# Patient Record
Sex: Male | Born: 1935 | Race: White | Hispanic: No | Marital: Married | Smoking: Never smoker
Health system: Southern US, Community
[De-identification: ages and names within clinical notes are randomized; demographics above are authoritative.]

## PROBLEM LIST (undated history)

## (undated) DIAGNOSIS — I340 Nonrheumatic mitral (valve) insufficiency: Secondary | ICD-10-CM

## (undated) DIAGNOSIS — I10 Essential (primary) hypertension: Secondary | ICD-10-CM

## (undated) DIAGNOSIS — H9190 Unspecified hearing loss, unspecified ear: Secondary | ICD-10-CM

## (undated) HISTORY — PX: PACEMAKER INSERTION: SHX728

## (undated) HISTORY — PX: CORONARY ARTERY BYPASS GRAFT: SHX141

## (undated) HISTORY — PX: CARDIAC DEFIBRILLATOR PLACEMENT: SHX171

---

## 2014-11-07 ENCOUNTER — Encounter (HOSPITAL_COMMUNITY): Payer: Self-pay | Admitting: Vascular Surgery

## 2014-11-07 ENCOUNTER — Emergency Department (HOSPITAL_COMMUNITY): Payer: No Typology Code available for payment source

## 2014-11-07 DIAGNOSIS — Z880 Allergy status to penicillin: Secondary | ICD-10-CM | POA: Insufficient documentation

## 2014-11-07 DIAGNOSIS — Z951 Presence of aortocoronary bypass graft: Secondary | ICD-10-CM | POA: Insufficient documentation

## 2014-11-07 DIAGNOSIS — S4991XA Unspecified injury of right shoulder and upper arm, initial encounter: Secondary | ICD-10-CM | POA: Insufficient documentation

## 2014-11-07 DIAGNOSIS — Y998 Other external cause status: Secondary | ICD-10-CM | POA: Diagnosis not present

## 2014-11-07 DIAGNOSIS — Z95810 Presence of automatic (implantable) cardiac defibrillator: Secondary | ICD-10-CM | POA: Insufficient documentation

## 2014-11-07 DIAGNOSIS — I10 Essential (primary) hypertension: Secondary | ICD-10-CM | POA: Insufficient documentation

## 2014-11-07 DIAGNOSIS — Z79899 Other long term (current) drug therapy: Secondary | ICD-10-CM | POA: Insufficient documentation

## 2014-11-07 DIAGNOSIS — Y92410 Unspecified street and highway as the place of occurrence of the external cause: Secondary | ICD-10-CM | POA: Insufficient documentation

## 2014-11-07 DIAGNOSIS — H9190 Unspecified hearing loss, unspecified ear: Secondary | ICD-10-CM | POA: Diagnosis not present

## 2014-11-07 DIAGNOSIS — S199XXA Unspecified injury of neck, initial encounter: Secondary | ICD-10-CM | POA: Insufficient documentation

## 2014-11-07 DIAGNOSIS — S0990XA Unspecified injury of head, initial encounter: Secondary | ICD-10-CM | POA: Diagnosis not present

## 2014-11-07 DIAGNOSIS — Y9389 Activity, other specified: Secondary | ICD-10-CM | POA: Diagnosis not present
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
## 2014-11-08 ENCOUNTER — Emergency Department (HOSPITAL_COMMUNITY): Payer: No Typology Code available for payment source

## 2014-11-08 ENCOUNTER — Encounter (HOSPITAL_COMMUNITY): Payer: Self-pay | Admitting: *Deleted

## 2014-11-08 ENCOUNTER — Emergency Department (HOSPITAL_COMMUNITY)
Admission: EM | Admit: 2014-11-08 | Discharge: 2014-11-08 | Disposition: A | Discharge status: Home / Self Care | Payer: No Typology Code available for payment source | Attending: Emergency Medicine | Admitting: Emergency Medicine

## 2014-11-08 DIAGNOSIS — S4991XA Unspecified injury of right shoulder and upper arm, initial encounter: Secondary | ICD-10-CM | POA: Diagnosis not present

## 2014-11-08 HISTORY — DX: Unspecified hearing loss, unspecified ear: H91.90

## 2014-11-08 HISTORY — DX: Essential (primary) hypertension: I10

## 2014-11-08 MED ORDER — TRAMADOL HCL 50 MG PO TABS
50.0000 mg | ORAL_TABLET | Freq: Once | ORAL | Status: AC
  Administered 2014-11-08: 50 mg via ORAL
  Filled 2014-11-08: qty 1
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
## 2014-11-08 NOTE — ED Notes (Signed)
Patient transported to CT 

## 2014-11-08 NOTE — ED Notes (Signed)
Pt provided w/ ice pack

## 2014-11-08 NOTE — ED Notes (Signed)
Pt ambulating independently w/ steady gait on d/c in no acute distress, A&Ox4.D/c instructions reviewed w/ pt and family - pt and family deny any further questions or concerns at present.  

## 2014-11-08 NOTE — ED Notes (Signed)
Pt went to CT
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
## 2014-11-08 NOTE — ED Notes (Signed)
Pt declined an ice pack at this time
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
## 2016-12-22 ENCOUNTER — Encounter

## 2016-12-24 ENCOUNTER — Ambulatory Visit
Admit: 2016-12-24 | Discharge: 2016-12-24 | Payer: MEDICARE | Attending: Thoracic Surgery (Cardiothoracic Vascular Surgery) | Primary: Family Medicine

## 2016-12-24 ENCOUNTER — Inpatient Hospital Stay: Admit: 2016-12-24 | Payer: MEDICARE | Attending: Nurse Practitioner | Primary: Family Medicine

## 2016-12-24 ENCOUNTER — Ambulatory Visit: Attending: Thoracic Surgery (Cardiothoracic Vascular Surgery) | Primary: Family Medicine

## 2016-12-24 DIAGNOSIS — I340 Nonrheumatic mitral (valve) insufficiency: Secondary | ICD-10-CM

## 2016-12-24 LAB — ECHOCARDIOGRAM COMPLETE 2D W DOPPLER W COLOR: Left Ventricular Ejection Fraction: 58

## 2016-12-24 NOTE — Progress Notes (Signed)
Patient: David Le   Age: 81 y.o.     Patient Care Team:  Veva Holes, MD as PCP - General (Family Practice)  Ron Agee, MD (Cardiothoracic Surgery)  Altha Harm, MD (Cardiology)    PCP: Veva Holes, MD    Cardiologist: Glendon Axe    Diagnosis/Reason for Consultation: The primary encounter diagnosis was Non-rheumatic mitral regurgitation. Diagnoses of DOE (dyspnea on exertion), Mitral valve insufficiency, unspecified etiology, Ischemic cardiomyopathy, S/P CABG x 3, S/P implantation of automatic cardioverter/defibrillator (AICD), and Coronary artery disease involving native coronary artery of native heart without angina pectoris were also pertinent to this visit.    Problem List:   Patient Active Problem List   Diagnosis Code   ??? Non-rheumatic mitral regurgitation I34.0   ??? Coronary artery disease involving native coronary artery of native heart without angina pectoris I25.10   ??? Ischemic cardiomyopathy I25.5   ??? S/P CABG x 3 Z95.1   ??? S/P implantation of automatic cardioverter/defibrillator (AICD) Z95.810       HPI: 81 y.o. Caucasian male with PMHx of MR, ICM, CABG X 3 (LIMA to LAD, SVG to Cx, SVG to PDA by Dr. Cristal Deer 11/2005), BiV AICD (06/2006 and generator change [facility] Scientific CRT-D Model G141 / Serial# [id number] in 10/2013), s/p cholecystectomy, s/p inguinal hernia repair that is [facility] by Dr. Glendon Axe for interventional evaluation of MR.    Mr. David Le has known about his MR since his CABG in 2007.  It has been tracked serially by Dr. Glendon Axe.  As his most recent routine f/u he c/o fatigue and easy DOE.  A TTE in May revealed mod-severe MR.  During a [place] the [place] in [place] he had worsening SOB/DOE.  Today he c/o SOB/DOE w/ walking on a flat surface after about 10-15 minutes. He also c/o two pillow orthopnea and PND 2-3 times/night.  He denies any CP, chest tightness, palpitations, lightheadedness, syncope, falls or  arrythmias.  He further denies any cardiac hospitalization in the past 12+ months. He denies any medication changes in the past 6+ months.     He is HOH with hearing aides.  He is accompanied by his wife today.  He is independent in his ADLs, medication administration, and medical decision making.     NYHA Classification: II   Class II (Mild): Slight limitation of physical activity. Comfortable at rest, but ordinary physical activity results in fatigue, palpitation, or dyspnea.     Angina Classification: 0   Class 0: No symptoms    Past Medical History:   Diagnosis Date   ??? CAD (coronary artery disease)    ??? Congestive heart failure (HCC)    ??? ICD (implantable cardioverter-defibrillator) in place    ??? Murmur    ??? Pacemaker    ??? Valvular heart disease      Endocarditis: no  Pulmonary HTN:  No Greater than 2/3 systemic: N/A  Immunocompromised/Steroids: no  Heart Failure Admission w/in past year:  no  Heart Failure Admission w/in past 2 weeks: no  Liver Dz/Cirrhosis: no  If yes, MELD score: N/A    Past Surgical History:   Procedure Laterality Date   ??? HX CORONARY ARTERY BYPASS GRAFT     ??? HX HEART CATHETERIZATION     ??? HX PACEMAKER        Social History     Tobacco Use   ??? Smoking status: Not on file   Substance Use Topics   ??? Alcohol  use: Not on file      No family history on file.  Prior to Admission medications    Medication Sig Start Date End Date Taking? Authorizing Provider   acetaminophen (TYLENOL EXTRA STRENGTH) 500 mg tablet Take 500 mg by mouth.   Yes Provider, Historical   aspirin delayed-release 81 mg tablet Take 81 mg by mouth daily.   Yes Provider, Historical   atorvastatin (LIPITOR) 20 mg tablet Take 20 mg by mouth daily.   Yes Provider, Historical   carvedilol (COREG) 12.5 mg tablet Take 12.5 mg by mouth two (2) times daily (with meals).   Yes Provider, Historical   ferrous gluconate 324 mg (38 mg iron) tablet Take  by mouth Daily (before breakfast).   Yes Provider, Historical    folic acid 800 mcg tablet Take 800 mcg by mouth daily.   Yes Provider, Historical   furosemide (LASIX) 10 mg/mL oral solution Take 10 mg by mouth daily.   Yes Provider, Historical   lisinopril (PRINIVIL, ZESTRIL) 2.5 mg tablet Take 2.5 mg by mouth two (2) times a day.   Yes Provider, Historical   potassium chloride (K-DUR, KLOR-CON) 20 mEq tablet Take 20 mEq by mouth two (2) times a day.   Yes Provider, Historical   spironolactone (ALDACTONE) 25 mg tablet Take 25 mg by mouth daily.   Yes Provider, Historical   melatonin tab tablet Take 5 mg by mouth as needed.   Yes Provider, Historical       Allergies   Allergen Reactions   ??? Pcn [Penicillins] Unknown (comments)       Current Medications:     Vitals: Blood pressure 116/66, pulse 70, temperature 97.5 ??F (36.4 ??C), temperature source Oral, resp. rate 24, height 5' 10.5" (1.791 m), weight 179 lb (81.2 kg), SpO2 100 %.     Pre 6 min walk VS: HR 68, RR 16, 94/56, 95% sat on RA    Allergies: is allergic to pcn [penicillins].    Review of Systems: Pertinent Positives per HPI   [x] Total of 13 systems reviewed as follows:  Constitutional: Negative fever, negative chills  Eyes:   Negative for amauroses fugax  ENT:   Negative sore throat,oral absecess  Endocrine Negative for thyroid replacement Rx; goiter; DM  Respiratory:  Negative chronic cough,sputum production  Cards:   Negative for palpitations, lower extremity edema, varicosities, claudication  GI:   Negative for dysphagia, bleeding, nausea, vomiting, diarrhea, and abdominal pain  Genitourinary: Negative for frequency, dysuria, BPH   Integument:  Negative for rash and pruritus  Hematologic:  Negative for easy bruising; bleeding dyscarsia  Musculoskel: Negative for muscle weakness inhibiting ambulation  Neurological:  Negative for stroke, TIA, syncope, dizziness  Behavl/Psych: Negative for feelings of anxiety, depression     Cardiovascular Testing:   EKG: none     TTE  06/30/2016:  LVEF 60% w/o any abnormal motion, Grade II Diastolic Dysfunction, RV wnl, RVSP estimated 42 mmHg, thickened AV leaflets, mod-severe MR w/ thickened leaflets and w/ posterior MV prolapse, TV wnl, mild AI    TTE today: done but read not available at time of this note    TEE 12/17/2016: LVEF wnl, LA enlargement, severe MR, mild AI    L/R Cardiac catheterization 12/17/2016: Grafts patent; RA: 6, RV: 31/5/-, PA: 30/15/19, PCWP: 13    Physical Exam:  General: Well nourished well groomed gentleman appearing stated age accompanied by his wife  Neuro: A&OX3. MAE. PERRL. Steady un-assisted gait  Head:Normocephalic. Atraumatic. Symmetrical  Neck:  Trachea Midline  Resp: CTA B. No Adv BS/cough/sputum/tachypnea with seated conversation  CV: S1S2 RRR. SEM II/VI. No JVD/carotid bruits. Pink/warm/dry extremities. No LE peripheral edema  VH:QIONGE ab. Soft. NT/ND. Active BS  GU: Voids  Integ: No obvious s/s of infection or breakdown  Musculo/Skeletal: FROM in all major joints. Good muscle tone    Clinic Evaluation:   KCCQ-12: scanned into EMR    6 minute walk test: PreTest HR/02 sat:  See VS this visit - completed 6 min             Post Test HR/02 sat: 70 / 100% sat on RA             Distance Walked: 812 ft 5 inches    Frality Survey:  Myrtis Ser Index ADL - 6/6  scanned into EMR     STS 2.81 Risk Score / Predicted 30 day mortality: - calculations scanned into EMR   Procedure: MV Repair      Risk of Mortality: 2.273%    Morbidity or Mortality: 14.981%    Long Length of Stay: 6.762%    Short Length of Stay: 34.962%    Permanent Stroke: 1.606%    Prolonged Ventilation: 8.208%    DSW Infection: 0.192%    Renal Failure: 3.652%    Reoperation: 7.017%   Procedure: MV Replacement Only    Risk of Mortality: 3.69%    Morbidity or Mortality: 21.042%    Long Length of Stay: 10.329%    Short Length of Stay: 25.54%    Permanent Stroke: 1.606%    Prolonged Ventilation: 11.286%    DSW Infection: 0.192%    Renal Failure: 4.533%     Reoperation: 10.759%    Assessment/Plan:   1.  MR - severe and symptomatic - At least intermediate surgical risk. Risks/benefits of TMVr reviewed w/ pt and wife and they wish to proceed.    2. CAD s/p CABG X 3 (2007) - patent grafts on cath. ASA/BB/Statin per cards  3. ICM s/p BiV-AICD - generator change 2015 -  Copy of card scanned into EMR  4. Further plan/care by Dr. Reyes Ivan

## 2016-12-24 NOTE — Progress Notes (Signed)
Pt seen and examined  Full note reviewed  Cath and echos reviewed and discussed with Dr [name]   See note by Hoy RegisterLindsey Guirgues, NP   He has severe degenerative MR and symx   He is at high surgical risk given redo sternotomy and patent bypass graphs   Discussed indications and risk for TMVR   He agrees to proceed

## 2016-12-24 NOTE — Addendum Note (Signed)
Addended by: Sallyanne HaversERR, DEBORAH P on: 12/30/2016 11:34 AM     Modules accepted: Level of Service

## 2017-01-07 ENCOUNTER — Ambulatory Visit
Admit: 2017-01-07 | Discharge: 2017-01-07 | Payer: MEDICARE | Attending: Thoracic Surgery (Cardiothoracic Vascular Surgery) | Primary: Family Medicine

## 2017-01-07 ENCOUNTER — Encounter

## 2017-01-07 ENCOUNTER — Inpatient Hospital Stay: Admit: 2017-01-07 | Payer: MEDICARE | Attending: Family | Primary: Family Medicine

## 2017-01-07 ENCOUNTER — Inpatient Hospital Stay: Admit: 2017-01-07 | Payer: MEDICARE | Primary: Family Medicine

## 2017-01-07 DIAGNOSIS — Z01818 Encounter for other preprocedural examination: Secondary | ICD-10-CM

## 2017-01-07 LAB — CBC WITH AUTOMATED DIFF
ABS. BASOPHILS: 0 10*3/uL (ref 0.0–0.1)
ABS. EOSINOPHILS: 0.3 10*3/uL (ref 0.0–0.4)
ABS. IMM. GRANS.: 0 10*3/uL (ref 0.00–0.04)
ABS. LYMPHOCYTES: 1.3 10*3/uL (ref 0.8–3.5)
ABS. MONOCYTES: 0.9 10*3/uL (ref 0.0–1.0)
ABS. NEUTROPHILS: 6.1 10*3/uL (ref 1.8–8.0)
ABSOLUTE NRBC: 0 10*3/uL (ref 0.00–0.01)
BASOPHILS: 1 % (ref 0–1)
EOSINOPHILS: 3 % (ref 0–7)
HCT: 40.9 % (ref 36.6–50.3)
HGB: 13.9 g/dL (ref 12.1–17.0)
IMMATURE GRANULOCYTES: 0 % (ref 0.0–0.5)
LYMPHOCYTES: 15 % (ref 12–49)
MCH: 33.7 PG (ref 26.0–34.0)
MCHC: 34 g/dL (ref 30.0–36.5)
MCV: 99 FL (ref 80.0–99.0)
MONOCYTES: 10 % (ref 5–13)
MPV: 11.7 FL (ref 8.9–12.9)
NEUTROPHILS: 71 % (ref 32–75)
NRBC: 0 PER 100 WBC
PLATELET: 137 10*3/uL — ABNORMAL LOW (ref 150–400)
RBC: 4.13 M/uL (ref 4.10–5.70)
RDW: 12.4 % (ref 11.5–14.5)
WBC: 8.6 10*3/uL (ref 4.1–11.1)

## 2017-01-07 LAB — POC G3 - PUL
Base excess (POC): 2 mmol/L
FIO2 (POC): 21 %
HCO3 (POC): 26.6 MMOL/L — ABNORMAL HIGH (ref 22–26)
pCO2 (POC): 39.5 MMHG (ref 35.0–45.0)
pH (POC): 7.437 (ref 7.35–7.45)
pO2 (POC): 75 MMHG — ABNORMAL LOW (ref 80–100)
sO2 (POC): 95 % (ref 92–97)

## 2017-01-07 LAB — METABOLIC PANEL, COMPREHENSIVE
A-G Ratio: 0.9 — ABNORMAL LOW (ref 1.1–2.2)
ALT (SGPT): 23 U/L (ref 12–78)
AST (SGOT): 13 U/L — ABNORMAL LOW (ref 15–37)
Albumin: 3.4 g/dL — ABNORMAL LOW (ref 3.5–5.0)
Alk. phosphatase: 52 U/L (ref 45–117)
Anion gap: 8 mmol/L (ref 5–15)
BUN/Creatinine ratio: 13 (ref 12–20)
BUN: 13 MG/DL (ref 6–20)
Bilirubin, total: 0.6 MG/DL (ref 0.2–1.0)
CO2: 28 mmol/L (ref 21–32)
Calcium: 8.7 MG/DL (ref 8.5–10.1)
Chloride: 105 mmol/L (ref 97–108)
Creatinine: 0.99 MG/DL (ref 0.70–1.30)
GFR est AA: >60 mL/min/{1.73_m2} (ref >60)
GFR est non-AA: >60 mL/min/{1.73_m2} (ref >60)
Globulin: 3.7 g/dL (ref 2.0–4.0)
Glucose: 110 mg/dL — ABNORMAL HIGH (ref 65–100)
Potassium: 4.2 mmol/L (ref 3.5–5.1)
Protein, total: 7.1 g/dL (ref 6.4–8.2)
Sodium: 141 mmol/L (ref 136–145)

## 2017-01-07 LAB — EKG, 12 LEAD, INITIAL
Atrial Rate: 65 {beats}/min
Calculated P Axis: 68 degrees
Calculated R Axis: -173 degrees
Calculated T Axis: 79 degrees
P-R Interval: 150 ms
Q-T Interval: 466 ms
QRS Duration: 154 ms
QTC Calculation (Bezet): 484 ms
Ventricular Rate: 65 {beats}/min

## 2017-01-07 LAB — URINALYSIS W/ REFLEX CULTURE
Bacteria: NEGATIVE /hpf
Bilirubin: NEGATIVE
Blood: NEGATIVE
Glucose: NEGATIVE mg/dL
Ketone: NEGATIVE mg/dL
Nitrites: NEGATIVE
Protein: NEGATIVE mg/dL
Specific gravity: 1.015 (ref 1.003–1.030)
Urobilinogen: 0.2 EU/dL (ref 0.2–1.0)
pH (UA): 5 (ref 5.0–8.0)

## 2017-01-07 LAB — PTT: aPTT: 28.8 s (ref 22.1–32.0)

## 2017-01-07 LAB — TYPE + CROSSMATCH
ABO/Rh(D): O POS
Antibody screen: NEGATIVE

## 2017-01-07 LAB — PROTHROMBIN TIME + INR
INR: 1 (ref 0.9–1.1)
Prothrombin time: 10.5 s (ref 9.0–11.1)

## 2017-01-07 LAB — HEMOGLOBIN A1C WITH EAG
Est. average glucose: 117 mg/dL
Hemoglobin A1c: 5.7 % (ref 4.2–6.3)

## 2017-01-07 LAB — MAGNESIUM: Magnesium: 2.2 mg/dL (ref 1.6–2.4)

## 2017-01-07 LAB — NT-PRO BNP: NT pro-BNP: 310 PG/ML (ref 0–450)

## 2017-01-07 LAB — TSH 3RD GENERATION: TSH: 2.66 u[IU]/mL (ref 0.36–3.74)

## 2017-01-07 LAB — EKG 12-LEAD
Atrial Rate: 65 {beats}/min
P Axis: 68 degrees
P-R Interval: 150 ms
Q-T Interval: 466 ms
QRS Duration: 154 ms
QTc Calculation (Bazett): 484 ms
R Axis: -173 degrees
T Axis: 79 degrees
Ventricular Rate: 65 {beats}/min

## 2017-01-07 NOTE — H&P (Signed)
CSS   History and Physical    Subjective:    **information obtained from previous office note -update at the bottom    81 y.o. Caucasian male with PMHx of MR, ICM, CABG X 3 (LIMA to LAD, SVG to Cx, SVG to PDA by Dr. Cristal Deer 11/2005), BiV AICD (06/2006 and generator change [facility] Scientific CRT-D Model G141 / Serial# [id number] in 10/2013), s/p cholecystectomy, s/p inguinal hernia repair that is [facility] by Dr. Glendon Axe for interventional evaluation of MR.  ??  Mr. Graumann has known about his MR since his CABG in 2007.  It has been tracked serially by Dr. Glendon Axe.  As his most recent routine f/u he c/o fatigue and easy DOE.  A TTE in May revealed mod-severe MR.  During a vacation to the national parks in [place] he had worsening SOB/DOE.  Today he c/o SOB/DOE w/ walking on a flat surface after about 10-15 minutes. He also c/o two pillow orthopnea and PND 2-3 times/night.  He denies any CP, chest tightness, palpitations, lightheadedness, syncope, falls or arrythmias.  He further denies any cardiac hospitalization in the past 12+ months. He denies any medication changes in the past 6+ months.   ??  He is HOH with hearing aides.  He is accompanied by his wife today.  He is independent in his ADLs, medication administration, and medical decision making.    **update: The patient has been complaining of an URI with nasal congestion. [facility] saw him in his home on 01/05/17 and prescribed nasacort and coricidin hbp. The patient reports improvement in symptoms over the past couple of days.      NYHA Classification: II              Class II (Mild): Slight limitation of physical activity. Comfortable at rest, but ordinary physical activity results in fatigue, palpitation, or dyspnea.                Angina Classification: 0              Class 0: No symptoms    Cardiac Testing    TTE  06/30/2016:  LVEF 60% w/o any abnormal motion, Grade II Diastolic  Dysfunction, RV wnl, RVSP estimated 42 mmHg, thickened AV leaflets, mod-severe MR w/ thickened leaflets and w/ posterior MV prolapse, TV wnl, mild AI  ??  TTE 12/24/16: Procedure information: Image quality was fair.    Left ventricle: Systolic function was normal. Ejection fraction was  estimated in the range of 55 % to 60 %. There was mild hypokinesis of the  apical anterior wall(s).    Left atrium: The atrium was mildly dilated.    Mitral valve: There was a mild prolapse involving the anterior and  posterior leaflets. There was moderate to severe regurgitation.    Tricuspid valve: There was mild to moderate regurgitation.  ??  TEE 12/17/2016: LVEF wnl, LA enlargement, severe MR, mild AI  ??  L/R Cardiac catheterization 12/17/2016: Grafts patent; RA: 6, RV: 31/5/-, PA: 30/15/19, PCWP: 13        Past Medical History:   Diagnosis Date   ??? CAD (coronary artery disease)    ??? Congestive heart failure (HCC)    ??? ICD (implantable cardioverter-defibrillator) in place    ??? Murmur    ??? Pacemaker    ??? Valvular heart disease      Past Surgical History:   Procedure Laterality Date   ??? HX CORONARY ARTERY BYPASS GRAFT     ???  HX HEART CATHETERIZATION     ??? HX PACEMAKER        Social History     Tobacco Use   ??? Smoking status: Not on file   Substance Use Topics   ??? Alcohol use: Not on file      No family history on file.  Prior to Admission medications    Medication Sig Start Date End Date Taking? Authorizing Provider   acetaminophen (TYLENOL EXTRA STRENGTH) 500 mg tablet Take 500 mg by mouth.    Provider, Historical   aspirin delayed-release 81 mg tablet Take 81 mg by mouth daily.    Provider, Historical   atorvastatin (LIPITOR) 20 mg tablet Take 20 mg by mouth daily.    Provider, Historical   carvedilol (COREG) 12.5 mg tablet Take 12.5 mg by mouth two (2) times daily (with meals).    Provider, Historical   ferrous gluconate 324 mg (38 mg iron) tablet Take  by mouth Daily (before breakfast).    Provider, Historical    folic acid 800 mcg tablet Take 800 mcg by mouth daily.    Provider, Historical   furosemide (LASIX) 10 mg/mL oral solution Take 10 mg by mouth daily.    Provider, Historical   lisinopril (PRINIVIL, ZESTRIL) 2.5 mg tablet Take 2.5 mg by mouth two (2) times a day.    Provider, Historical   potassium chloride (K-DUR, KLOR-CON) 20 mEq tablet Take 20 mEq by mouth two (2) times a day.    Provider, Historical   spironolactone (ALDACTONE) 25 mg tablet Take 25 mg by mouth daily.    Provider, Historical   melatonin tab tablet Take 5 mg by mouth as needed.    Provider, Historical       Allergies   Allergen Reactions   ??? Pcn [Penicillins] Unknown (comments)     Review of Systems: pertinent positives in HPI  Consititutional: Denies fever or chills.  Eyes:  Denies use of glasses or vision problems(cataracts).  ENT:  Denies hearing or swallowing difficulty.  CV: Denies CP, claudication, HTN.  Resp: Denies dyspnea, productive cough.  GU: Denies dialysis or kidney problems.  GI: Denies ulcers, esophageal strictures, liver problems.  M/S: Denies joint or bone problems, or implanted artificial hardware.  Skin: Denies varicose veins, edema.   Neuro: Denies strokes, or TIAs.  Psych: Denies anxiety or depression.  Endocrine: Denies thyroid problems or diabetes.  Heme/Lymphatic: Denies easy bruising or lymphedema.     Objective:     VS: T 97.8 HR 67 RR 16 BP 102/64 sats 94%    Physical Exam:    General appearance: alert, cooperative, no distress  Head: normocephalic, without obvious abnormality; atraumatic  Eyes: conjunctivae/corneas clear; EOM's intact.   Nose: nares normal; no drainage.  Neck: no carotid bruit and no JVD  Lungs: clear to auscultation bilaterally  Heart: regular rate and rhythm; +2/6 murmur  Abdomen: soft, non-tender; bowel sounds normal  Extremities: moves all extremities; no weakness.  Skin: Skin color normal; No varicose veins or edema.  Neurologic: Grossly normal      Labs:   Recent Labs     01/07/17  1205    WBC 8.6   HGB 13.9   HCT 40.9   PLT 137*   NA 141   K 4.2   BUN 13   CREA 0.99   GLU 110*   INR 1.0       Diagnostics:   PA and lateral: IMPRESSION: There is minor atelectasis/scarring left base otherwise lungs  are  clear    Carotid doppler: pending    PFTS-FEV1: pending    EKG: Electronic ventricular pacemaker     Assessment:     Active Problems:    Non-rheumatic mitral regurgitation (12/24/2016)        Plan:   The risk and benefit of surgery were reviewed with patient and family and all questions answered and the patient wishes to proceed. Risk include infection, bleeding, stroke, heart attack, irregular heart rhythm, kidney failure and death. The patient was instructed to take last dose of lasix/K, lisinopril, aldactone on 01/12/17.    Surgery is scheduled for 01/14/17.    STS 2.81 Risk Score / Predicted 30 day mortality: - calculations scanned into EMR              Procedure: MV Repair                                                Risk of Mortality: 2.273%                          Morbidity or Mortality: 14.981%                          Long Length of Stay: 6.762%                          Short Length of Stay: 34.962%                          Permanent Stroke: 1.606%                          Prolonged Ventilation: 8.208%                          DSW Infection: 0.192%                          Renal Failure: 3.652%                          Reoperation: 7.017%              Procedure: MV Replacement Only                          Risk of Mortality: 3.69%                          Morbidity or Mortality: 21.042%                          Long Length of Stay: 10.329%                          Short Length of Stay: 25.54%                          Permanent Stroke: 1.606%  Prolonged Ventilation: 11.286%                          DSW Infection: 0.192%                          Renal Failure: 4.533%                          Reoperation: 10.759%  ??  Assessment/Plan:    1.  MR: Plan for TMVr with Dr. Reyes IvanBladergroen and Dr. Jaynie CollinsLim.  2. CAD s/p CABG X 3 (2007) - patent grafts on cath. ASA/BB/Statin per cards  3. ICM s/p BiV-AICD - generator change 2015 -  Copy of card scanned into EMR  4. Further plan/care by Dr. Reyes IvanBladergroen           Signed By: Clide CliffMeredith Ranard Harte, NP     January 07, 2017

## 2017-01-07 NOTE — Progress Notes (Signed)
I had the pleasure of seeing David Le in the valve clinic - he has severe MR due to prolapse/flail, and went into HF at altitude recently. Per Dr. Marva PandaBladergroen's assessment, he is high surgical risk due to overall conditioning, and would be reasonable for a MitraClip.   He currently is recovering from a URI, and will re-assess him Friday to decide about proceding next week.

## 2017-01-07 NOTE — Other (Signed)
Anesthesia called to consult on upcoming cardaic surgery; anesthesia unavailable at this time; pt sent to [facility] for abg's

## 2017-01-09 ENCOUNTER — Encounter: Attending: Thoracic Surgery (Cardiothoracic Vascular Surgery) | Primary: Family Medicine

## 2017-01-09 ENCOUNTER — Inpatient Hospital Stay: Admit: 2017-01-09 | Payer: MEDICARE | Attending: Acute Care | Primary: Family Medicine

## 2017-01-09 ENCOUNTER — Ambulatory Visit
Admit: 2017-01-09 | Discharge: 2017-01-09 | Payer: MEDICARE | Attending: Thoracic Surgery (Cardiothoracic Vascular Surgery) | Primary: Family Medicine

## 2017-01-09 DIAGNOSIS — J189 Pneumonia, unspecified organism: Secondary | ICD-10-CM

## 2017-01-09 DIAGNOSIS — I340 Nonrheumatic mitral (valve) insufficiency: Secondary | ICD-10-CM

## 2017-01-09 NOTE — Procedures (Signed)
[facility]  PULMON[facility] FUN[facility]    [name]:[name], David  MR#: [id number]  DOB: 04-27-1935  ACCOUNT #: [id number]   DATE OF SERVICE: 01/09/2017    REFERRING:  Clide CliffMeredith Newton, MD, Veva HolesJohn Siedlecki, MD    INDICATION:  Non-rheumatic mitral regurg.    Pulmonary functions, 01/09/2017, are reviewed.  Patient's vital capacity is 69% of predicted, 2.85 liters.  FEV1 is 1.99 liters, 75% of predicted.  FEV1/FVC ratio is preserved at 69% of predicted, 64.  Flow volume loop demonstrates minimal coving of the expiratory limb.    IMPRESSION:  Possible mild restrictive respiratory pulmonary mechanic abnormality without evidence of overt obstruction.  Clinical correlation and consideration of additional testing with complete PFTs advised.      Clarita CraneANIEL R. Zakaree Mcclenahan, MD       DRS / DN  D: 01/12/2017 09:02     T: 01/12/2017 09:48  JOB #: [id number]

## 2017-01-09 NOTE — Progress Notes (Signed)
[facility] NP:  Pt in office today for PNA f/u.    Saw PCP this morning and received cephtriaxone injection and will receive two more in the next days.    Quite somulent today. Cough slightly improved. Will defer TMVr until recovered from infectious process.  All understand and are in agreement.  TMVr rescheduled for 12/5.

## 2017-01-11 NOTE — Procedures (Signed)
[facility]  *** FINAL REPORT ***    Name: David Le, David Le  MRN: [id number]    Outpatient  DOB: 12 Sep 1935  HIS Order #: [id number]  TRAKnet Visit #: [id number]  Date: 09 Jan 2017    TYPE OF TEST: Cerebrovascular Duplex    REASON FOR TEST  pre-op for cardiac surgery    Right Carotid:-             Proximal               Mid                 Distal  cm/s  Systolic  Diastolic  Systolic  Diastolic  Systolic  Diastolic  CCA:     08.677.0      13.0                            72.0      12.0  Bulb:  ECA:    118.0  ICA:     84.0      27.0       86.0      26.0       72.0      19.0  ICA/CCA:  1.2       2.3    ICA Stenosis:    Right Vertebral:-  Finding: Antegrade  Sys:       47.0  Dia:    Right Subclavian:    Left Carotid:-            Proximal                Mid                 Distal  cm/s  Systolic  Diastolic  Systolic  Diastolic  Systolic  Diastolic  CCA:     57.873.0       9.0                            87.0      16.0  Bulb:  ECA:    116.0  ICA:     71.0      17.0       61.0      15.0       74.0      22.0  ICA/CCA:  0.8       1.1    ICA Stenosis:    Left Vertebral:-  Finding: Antegrade  Sys:       46.0  Dia:    Left Subclavian:    INTERPRETATION/FINDINGS  PROCEDURE:  Color duplex ultrasound imaging of extracranial  cerebrovascular arteries.    FINDINGS:       Right:  Internal carotid velocity is within normal limits.  There   is narrowing of the internal carotid flow channel on color Doppler  imaging and calcific plaque on B-mode imaging, consistent with less  than 50 percent stenosis.  The common and external carotid arteries  are patent and without evidence of hemodynamically significant  stenosis.       Left:  Internal carotid velocity is within normal limits.  There  is narrowing of the internal carotid flow channel on color Doppler  imaging and mixed density plaque on B-mode imaging, consistent with  less than 50 percent stenosis.  The common and external carotid  arteries are patent and without evidence  of  hemodynamically  significant stenosis.    IMPRESSION:  Consistent with less than 50% stenosis of the right  internal carotid and less than 50% stenosis (low end) of the left  internal carotid.  Vertebrals are patent with antegrade flow.    ADDITIONAL COMMENTS    I have personally reviewed the data relevant to the interpretation of  this  study.    TECHNOLOGIST: Bonnye FavaJames Kaczor, RVT  Signed: 01/09/2017 02:34 PM    PHYSICIAN: Lenn SinkAvik Desi Rowe, MD  Signed: 01/11/2017 02:55 PM

## 2017-01-11 NOTE — Procedures (Signed)
[facility]  *** FINAL REPORT ***    Name: David Le, David Le  MRN: [id number]    Outpatient  DOB: 12 Sep 1935  HIS Order #: [id number]  TRAKnet Visit #: [id number]  Date: 09 Jan 2017    TYPE OF TEST: Cerebrovascular Duplex    REASON FOR TEST  pre-op for cardiac surgery    Right Carotid:-             Proximal               Mid                 Distal  cm/s  Systolic  Diastolic  Systolic  Diastolic  Systolic  Diastolic  CCA:     08.677.0      13.0                            72.0      12.0  Bulb:  ECA:    118.0  ICA:     84.0      27.0       86.0      26.0       72.0      19.0  ICA/CCA:  1.2       2.3    ICA Stenosis:    Right Vertebral:-  Finding: Antegrade  Sys:       47.0  Dia:    Right Subclavian:    Left Carotid:-            Proximal                Mid                 Distal  cm/s  Systolic  Diastolic  Systolic  Diastolic  Systolic  Diastolic  CCA:     57.873.0       9.0                            87.0      16.0  Bulb:  ECA:    116.0  ICA:     71.0      17.0       61.0      15.0       74.0      22.0  ICA/CCA:  0.8       1.1    ICA Stenosis:    Left Vertebral:-  Finding: Antegrade  Sys:       46.0  Dia:    Left Subclavian:    INTERPRETATION/FINDINGS  PROCEDURE:  Color duplex ultrasound imaging of extracranial  cerebrovascular arteries.    FINDINGS:       Right:  Internal carotid velocity is within normal limits.  There   is narrowing of the internal carotid flow channel on color Doppler  imaging and calcific plaque on B-mode imaging, consistent with less  than 50 percent stenosis.  The common and external carotid arteries  are patent and without evidence of hemodynamically significant  stenosis.       Left:  Internal carotid velocity is within normal limits.  There  is narrowing of the internal carotid flow channel on color Doppler  imaging and mixed density plaque on B-mode imaging, consistent with  less than 50 percent stenosis.  The common and external carotid   arteries are patent and without  evidence of hemodynamically  significant stenosis.    IMPRESSION:  Consistent with less than 50% stenosis of the right  internal carotid and less than 50% stenosis (low end) of the left  internal carotid.  Vertebrals are patent with antegrade flow.    ADDITIONAL COMMENTS    I have personally reviewed the data relevant to the interpretation of  this  study.    TECHNOLOGIST: Bonnye FavaJames Kaczor, RVT  Signed: 01/09/2017 02:34 PM    PHYSICIAN: Lenn SinkAvik Rusell Meneely, MD  Signed: 01/11/2017 02:55 PM

## 2017-01-12 NOTE — Procedures (Signed)
[facility]  PULMONARY FUNCTION    Name:David Le, David Le  MR#: [id number]  DOB: 08/10/1935  ACCOUNT #: [id number]   DATE OF SERVICE: 01/09/2017    REFERRING:  Meredith Newton, MD, John Siedlecki, MD    INDICATION:  Non-rheumatic mitral regurg.    Pulmonary functions, 01/09/2017, are reviewed.  Patient's vital capacity is 69% of predicted, 2.85 liters.  FEV1 is 1.99 liters, 75% of predicted.  FEV1/FVC ratio is preserved at 69% of predicted, 64.  Flow volume loop demonstrates minimal coving of the expiratory limb.    IMPRESSION:  Possible mild restrictive respiratory pulmonary mechanic abnormality without evidence of overt obstruction.  Clinical correlation and consideration of additional testing with complete PFTs advised.      Lindsi Bayliss R. Lindbergh Winkles, MD       DRS / DN  D: 01/12/2017 09:02     T: 01/12/2017 09:48  JOB #: [id number]

## 2017-01-14 ENCOUNTER — Encounter: Attending: Thoracic Surgery (Cardiothoracic Vascular Surgery) | Primary: Family Medicine

## 2017-01-23 NOTE — Telephone Encounter (Signed)
She thinks one of his medications has been left off the list and wants to check with you. Surgery is Wed. 12/5

## 2017-01-27 MED ORDER — PHENYLEPHRINE 10 MG/ML INJECTION
10 mg/mL | INTRAMUSCULAR | Status: DC
Start: 2017-01-27 — End: 2017-01-28

## 2017-01-27 MED ORDER — SODIUM CHLORIDE 0.9 % IV
100 unit/mL | INTRAVENOUS | Status: DC
Start: 2017-01-27 — End: 2017-01-28
  Administered 2017-01-28: 12:00:00 via INTRAVENOUS

## 2017-01-27 MED ORDER — PHENYLEPHRINE 10 MG/ML INJECTION
10 mg/mL | INTRAMUSCULAR | Status: DC
Start: 2017-01-27 — End: 2017-01-28
  Administered 2017-01-28 (×4): via INTRAVENOUS

## 2017-01-27 MED ORDER — IOPAMIDOL 41 % IV
200 mg iodine /mL (41 %) | Freq: Once | INTRAVENOUS | Status: DC
Start: 2017-01-27 — End: 2017-01-28
  Administered 2017-01-28: 12:00:00 via INTRAVENOUS

## 2017-01-27 MED ORDER — SODIUM CHLORIDE 0.9 % IV
1 mg/mL | INTRAVENOUS | Status: DC
Start: 2017-01-27 — End: 2017-01-28
  Administered 2017-01-28: 12:00:00 via INTRAVENOUS

## 2017-01-27 MED ORDER — NICARDIPINE 2.5 MG/ML IV
2510 mg/10 mL | INTRAVENOUS | Status: DC
Start: 2017-01-27 — End: 2017-01-28

## 2017-01-27 MED ORDER — SODIUM CHLORIDE 0.9 % IV
INTRAVENOUS | Status: AC
Start: 2017-01-27 — End: 2017-01-28
  Administered 2017-01-28: 16:00:00 via INTRAVENOUS

## 2017-01-27 MED FILL — SODIUM CHLORIDE 0.9 % IV: INTRAVENOUS | Qty: 1000

## 2017-01-27 MED FILL — ISOVUE-200  41 % INTRAVENOUS SOLUTION: 200 mg iodine /mL (41 %) | INTRAVENOUS | Qty: 200

## 2017-01-27 MED FILL — PHENYLEPHRINE 10 MG/ML INJECTION: 10 mg/mL | INTRAMUSCULAR | Qty: 1

## 2017-01-27 MED FILL — EPINEPHRINE 1 MG/ML IJ SOLN: 1 mg/mL | INTRAMUSCULAR | Qty: 2

## 2017-01-27 MED FILL — INSULIN REGULAR HUMAN 100 UNIT/ML INJECTION: 100 unit/mL | INTRAMUSCULAR | Qty: 1

## 2017-01-27 MED FILL — NICARDIPINE 2.5 MG/ML IV: 25 mg/10 mL | INTRAVENOUS | Qty: 20

## 2017-01-27 MED FILL — PHENYLEPHRINE 10 MG/ML INJECTION: 10 mg/mL | INTRAMUSCULAR | Qty: 3

## 2017-01-27 NOTE — Telephone Encounter (Signed)
Cardiac Surgery Care Coordinator-  Called David HoseJack Hao to review plan of care and day of surgery expectations.  Encouraged David HoseJack Idrovo to verbalize and offered emotional support.  David Le is without questions or concerns at this time.  Gavin PottersJill R Onie Kasparek, RN

## 2017-01-27 NOTE — Progress Notes (Signed)
Pt needs repeat T&C sent morning of surgery. Order is signed and held, thank you!

## 2017-01-28 ENCOUNTER — Inpatient Hospital Stay: Admit: 2017-01-28 | Payer: MEDICARE | Primary: Family Medicine

## 2017-01-28 ENCOUNTER — Inpatient Hospital Stay
Admit: 2017-01-28 | Discharge: 2017-01-29 | Disposition: A | Discharge status: Home / Self Care | Payer: MEDICARE | Attending: Thoracic Surgery (Cardiothoracic Vascular Surgery) | Admitting: Thoracic Surgery (Cardiothoracic Vascular Surgery)

## 2017-01-28 ENCOUNTER — Encounter
Admit: 2017-01-28 | Discharge: 2017-01-28 | Payer: MEDICARE | Attending: Thoracic Surgery (Cardiothoracic Vascular Surgery) | Primary: Family Medicine

## 2017-01-28 DIAGNOSIS — I340 Nonrheumatic mitral (valve) insufficiency: Secondary | ICD-10-CM

## 2017-01-28 LAB — CBC WITH AUTOMATED DIFF
ABS. BASOPHILS: 0 10*3/uL (ref 0.0–0.1)
ABS. EOSINOPHILS: 0.2 10*3/uL (ref 0.0–0.4)
ABS. IMM. GRANS.: 0 10*3/uL (ref 0.00–0.04)
ABS. LYMPHOCYTES: 1.6 10*3/uL (ref 0.8–3.5)
ABS. MONOCYTES: 0.4 10*3/uL (ref 0.0–1.0)
ABS. NEUTROPHILS: 3.7 10*3/uL (ref 1.8–8.0)
ABSOLUTE NRBC: 0 10*3/uL (ref 0.00–0.01)
BASOPHILS: 1 % (ref 0–1)
EOSINOPHILS: 4 % (ref 0–7)
HCT: 39.2 % (ref 36.6–50.3)
HGB: 13.1 g/dL (ref 12.1–17.0)
IMMATURE GRANULOCYTES: 0 % (ref 0.0–0.5)
LYMPHOCYTES: 26 % (ref 12–49)
MCH: 32.6 PG (ref 26.0–34.0)
MCHC: 33.4 g/dL (ref 30.0–36.5)
MCV: 97.5 FL (ref 80.0–99.0)
MONOCYTES: 7 % (ref 5–13)
MPV: 11.1 FL (ref 8.9–12.9)
NEUTROPHILS: 62 % (ref 32–75)
NRBC: 0 PER 100 WBC
PLATELET: 117 10*3/uL — ABNORMAL LOW (ref 150–400)
RBC: 4.02 M/uL — ABNORMAL LOW (ref 4.10–5.70)
RDW: 12.1 % (ref 11.5–14.5)
WBC: 6 10*3/uL (ref 4.1–11.1)

## 2017-01-28 LAB — METABOLIC PANEL, COMPREHENSIVE
A-G Ratio: 1 — ABNORMAL LOW (ref 1.1–2.2)
ALT (SGPT): 24 U/L (ref 12–78)
AST (SGOT): 17 U/L (ref 15–37)
Albumin: 2.9 g/dL — ABNORMAL LOW (ref 3.5–5.0)
Alk. phosphatase: 43 U/L — ABNORMAL LOW (ref 45–117)
Anion gap: 8 mmol/L (ref 5–15)
BUN/Creatinine ratio: 18 (ref 12–20)
BUN: 13 MG/DL (ref 6–20)
Bilirubin, total: 0.4 MG/DL (ref 0.2–1.0)
CO2: 23 mmol/L (ref 21–32)
Calcium: 7.7 MG/DL — ABNORMAL LOW (ref 8.5–10.1)
Chloride: 111 mmol/L — ABNORMAL HIGH (ref 97–108)
Creatinine: 0.74 MG/DL (ref 0.70–1.30)
GFR est AA: >60 mL/min/{1.73_m2} (ref >60)
GFR est non-AA: >60 mL/min/{1.73_m2} (ref >60)
Globulin: 2.9 g/dL (ref 2.0–4.0)
Glucose: 101 mg/dL — ABNORMAL HIGH (ref 65–100)
Potassium: 4 mmol/L (ref 3.5–5.1)
Protein, total: 5.8 g/dL — ABNORMAL LOW (ref 6.4–8.2)
Sodium: 142 mmol/L (ref 136–145)

## 2017-01-28 LAB — POC ACTIVATED CLOTTING TIME
Activated Clotting Time (POC): 241 SECS — ABNORMAL HIGH (ref 79–138)
Activated Clotting Time (POC): 241 SECS — ABNORMAL HIGH (ref 79–138)
Activated Clotting Time (POC): 384 SECS — ABNORMAL HIGH (ref 79–138)

## 2017-01-28 LAB — PROTHROMBIN TIME + INR
INR: 1.1 (ref 0.9–1.1)
Prothrombin time: 11.4 s — ABNORMAL HIGH (ref 9.0–11.1)

## 2017-01-28 LAB — PTT: aPTT: 31.9 s (ref 22.1–32.0)

## 2017-01-28 LAB — MAGNESIUM: Magnesium: 1.9 mg/dL (ref 1.6–2.4)

## 2017-01-28 MED ORDER — ONDANSETRON (PF) 4 MG/2 ML INJECTION
4 mg/2 mL | INTRAMUSCULAR | Status: DC | PRN
Start: 2017-01-28 — End: 2017-01-28
  Administered 2017-01-28: 15:00:00 via INTRAVENOUS

## 2017-01-28 MED ORDER — ROCURONIUM 10 MG/ML IV
10 mg/mL | INTRAVENOUS | Status: DC | PRN
Start: 2017-01-28 — End: 2017-01-28
  Administered 2017-01-28 (×2): via INTRAVENOUS

## 2017-01-28 MED ORDER — FENTANYL CITRATE (PF) 50 MCG/ML IJ SOLN
50 mcg/mL | INTRAMUSCULAR | Status: AC
Start: 2017-01-28 — End: ?

## 2017-01-28 MED ORDER — DIVALPROEX 500 MG 24 HR TAB
500 mg | Freq: Every day | ORAL | Status: DC
Start: 2017-01-28 — End: 2017-01-29
  Administered 2017-01-28 – 2017-01-29 (×2): via ORAL

## 2017-01-28 MED ORDER — PROTAMINE 10 MG/ML IV
10 mg/mL | INTRAVENOUS | Status: DC | PRN
Start: 2017-01-28 — End: 2017-01-28
  Administered 2017-01-28: 15:00:00 via INTRAVENOUS

## 2017-01-28 MED ORDER — FENTANYL CITRATE (PF) 50 MCG/ML IJ SOLN
50 mcg/mL | INTRAMUSCULAR | Status: DC | PRN
Start: 2017-01-28 — End: 2017-01-28
  Administered 2017-01-28: 12:00:00 via INTRAVENOUS

## 2017-01-28 MED ORDER — OXYCODONE 5 MG TAB
5 mg | ORAL | Status: DC | PRN
Start: 2017-01-28 — End: 2017-01-28

## 2017-01-28 MED ORDER — CEFAZOLIN 2 GRAM/20 ML IN STERILE WATER INTRAVENOUS SYRINGE
2 gram/0 mL | Freq: Three times a day (TID) | INTRAVENOUS | Status: AC
Start: 2017-01-28 — End: 2017-01-29
  Administered 2017-01-28 – 2017-01-29 (×3): via INTRAVENOUS

## 2017-01-28 MED ORDER — MAGNESIUM SULFATE IN D5W 1 GRAM/100 ML IV PIGGY BACK
1 gram/00 mL | Freq: Once | INTRAVENOUS | Status: AC
Start: 2017-01-28 — End: 2017-01-28
  Administered 2017-01-28: 19:00:00 via INTRAVENOUS

## 2017-01-28 MED ORDER — LACTATED RINGERS IV
INTRAVENOUS | Status: DC
Start: 2017-01-28 — End: 2017-01-28

## 2017-01-28 MED ORDER — FAMOTIDINE 20 MG TAB
20 mg | Freq: Two times a day (BID) | ORAL | Status: DC
Start: 2017-01-28 — End: 2017-01-29
  Administered 2017-01-29: 14:00:00 via ORAL

## 2017-01-28 MED ORDER — SODIUM CHLORIDE 0.9 % IJ SYRG
INTRAMUSCULAR | Status: DC | PRN
Start: 2017-01-28 — End: 2017-01-28

## 2017-01-28 MED ORDER — ONDANSETRON (PF) 4 MG/2 ML INJECTION
4 mg/2 mL | INTRAMUSCULAR | Status: DC | PRN
Start: 2017-01-28 — End: 2017-01-28

## 2017-01-28 MED ORDER — ASPIRIN 81 MG CHEWABLE TAB
81 mg | Freq: Every day | ORAL | Status: DC
Start: 2017-01-28 — End: 2017-01-29
  Administered 2017-01-29: 14:00:00 via ORAL

## 2017-01-28 MED ORDER — SODIUM CHLORIDE 0.9 % IV
INTRAVENOUS | Status: DC | PRN
Start: 2017-01-28 — End: 2017-01-28
  Administered 2017-01-28: 12:00:00 via INTRAVENOUS

## 2017-01-28 MED ORDER — SODIUM CHLORIDE 0.9 % IJ SYRG
INTRAMUSCULAR | Status: AC
Start: 2017-01-28 — End: ?

## 2017-01-28 MED ORDER — SENNOSIDES-DOCUSATE SODIUM 8.6 MG-50 MG TAB
Freq: Two times a day (BID) | ORAL | Status: DC
Start: 2017-01-28 — End: 2017-01-29
  Administered 2017-01-29: 14:00:00 via ORAL

## 2017-01-28 MED ORDER — ALBUTEROL SULFATE 0.083 % (0.83 MG/ML) SOLN FOR INHALATION
2.5 mg /3 mL (0.083 %) | RESPIRATORY_TRACT | Status: DC | PRN
Start: 2017-01-28 — End: 2017-01-29

## 2017-01-28 MED ORDER — GLYCOPYRROLATE 0.2 MG/ML IJ SOLN
0.2 mg/mL | INTRAMUSCULAR | Status: DC | PRN
Start: 2017-01-28 — End: 2017-01-28
  Administered 2017-01-28: 15:00:00 via INTRAVENOUS

## 2017-01-28 MED ORDER — SODIUM CHLORIDE 0.9 % IV
10010 mg/mL (10 %) | INTRAVENOUS | Status: DC | PRN
Start: 2017-01-28 — End: 2017-01-28

## 2017-01-28 MED ORDER — MORPHINE 10 MG/ML INJ SOLUTION
10 mg/ml | INTRAMUSCULAR | Status: DC | PRN
Start: 2017-01-28 — End: 2017-01-28

## 2017-01-28 MED ORDER — SODIUM CHLORIDE 0.45 % IV
0.45 % | INTRAVENOUS | Status: DC
Start: 2017-01-28 — End: 2017-01-28
  Administered 2017-01-28: 16:00:00 via INTRAVENOUS

## 2017-01-28 MED ORDER — FENTANYL CITRATE (PF) 50 MCG/ML IJ SOLN
50 mcg/mL | INTRAMUSCULAR | Status: DC | PRN
Start: 2017-01-28 — End: 2017-01-28

## 2017-01-28 MED ORDER — MAGNESIUM OXIDE 400 MG TAB
400 mg | Freq: Two times a day (BID) | ORAL | Status: DC
Start: 2017-01-28 — End: 2017-01-29
  Administered 2017-01-29: 14:00:00 via ORAL

## 2017-01-28 MED ORDER — SODIUM CHLORIDE 0.9 % IJ SYRG
Freq: Three times a day (TID) | INTRAMUSCULAR | Status: DC
Start: 2017-01-28 — End: 2017-01-28
  Administered 2017-01-28: 12:00:00 via INTRAVENOUS

## 2017-01-28 MED ORDER — MIDAZOLAM 1 MG/ML IJ SOLN
1 mg/mL | INTRAMUSCULAR | Status: DC | PRN
Start: 2017-01-28 — End: 2017-01-28

## 2017-01-28 MED ORDER — SODIUM CHLORIDE 0.9 % IJ SYRG
Freq: Three times a day (TID) | INTRAMUSCULAR | Status: DC
Start: 2017-01-28 — End: 2017-01-28
  Administered 2017-01-28 – 2017-01-29 (×2): via INTRAVENOUS

## 2017-01-28 MED ORDER — NALOXONE 0.4 MG/ML INJECTION
0.4 mg/mL | INTRAMUSCULAR | Status: DC | PRN
Start: 2017-01-28 — End: 2017-01-29

## 2017-01-28 MED ORDER — PHENYLEPHRINE IN 0.9 % SODIUM CL (40 MCG/ML) IV SYRINGE
0.4 mg/10 mL (40 mcg/mL) | INTRAVENOUS | Status: DC | PRN
Start: 2017-01-28 — End: 2017-01-28
  Administered 2017-01-28 (×2): via INTRAVENOUS

## 2017-01-28 MED ORDER — BISACODYL 10 MG RECTAL SUPPOSITORY
10 mg | Freq: Every day | RECTAL | Status: DC | PRN
Start: 2017-01-28 — End: 2017-01-29

## 2017-01-28 MED ORDER — SODIUM CHLORIDE 0.9 % IV
INTRAVENOUS | Status: DC
Start: 2017-01-28 — End: 2017-01-28
  Administered 2017-01-28: 16:00:00 via INTRAVENOUS

## 2017-01-28 MED ORDER — FAMOTIDINE (PF) 20 MG/2 ML IV
202 mg/2 mL | Freq: Two times a day (BID) | INTRAVENOUS | Status: AC
Start: 2017-01-28 — End: 2017-01-28
  Administered 2017-01-28 – 2017-01-29 (×2): via INTRAVENOUS

## 2017-01-28 MED ORDER — CEFAZOLIN 2 GRAM/20 ML IN STERILE WATER INTRAVENOUS SYRINGE
2 gram/0 mL | Freq: Once | INTRAVENOUS | Status: AC
Start: 2017-01-28 — End: 2017-01-28
  Administered 2017-01-28: 13:00:00 via INTRAVENOUS

## 2017-01-28 MED ORDER — ROPIVACAINE (PF) 5 MG/ML (0.5 %) INJECTION
5 mg/mL (0. %) | INTRAMUSCULAR | Status: DC | PRN
Start: 2017-01-28 — End: 2017-01-28

## 2017-01-28 MED ORDER — ELECTROLYTE REPLACEMENT PROTOCOL
Status: DC | PRN
Start: 2017-01-28 — End: 2017-01-28

## 2017-01-28 MED ORDER — MIDAZOLAM 1 MG/ML IJ SOLN
1 mg/mL | INTRAMUSCULAR | Status: AC
Start: 2017-01-28 — End: ?

## 2017-01-28 MED ORDER — OXYCODONE-ACETAMINOPHEN 5 MG-325 MG TAB
5-325 mg | ORAL | Status: DC | PRN
Start: 2017-01-28 — End: 2017-01-29
  Administered 2017-01-29: 07:00:00 via ORAL

## 2017-01-28 MED ORDER — LACTATED RINGERS IV
INTRAVENOUS | Status: DC | PRN
Start: 2017-01-28 — End: 2017-01-28
  Administered 2017-01-28: 12:00:00 via INTRAVENOUS

## 2017-01-28 MED ORDER — SODIUM CHLORIDE 0.9 % IV
INTRAVENOUS | Status: DC
Start: 2017-01-28 — End: 2017-01-28
  Administered 2017-01-28: 12:00:00 via INTRAVENOUS

## 2017-01-28 MED ORDER — SUCCINYLCHOLINE CHLORIDE 20 MG/ML INJECTION
20 mg/mL | INTRAMUSCULAR | Status: DC | PRN
Start: 2017-01-28 — End: 2017-01-28
  Administered 2017-01-28: 13:00:00 via INTRAVENOUS

## 2017-01-28 MED ORDER — HEPARIN (PORCINE) 1,000 UNIT/ML IJ SOLN
1000 unit/mL | INTRAMUSCULAR | Status: DC | PRN
Start: 2017-01-28 — End: 2017-01-28
  Administered 2017-01-28 (×3): via INTRAVENOUS

## 2017-01-28 MED ORDER — ALBUMIN, HUMAN 5 % IV
5 % | Freq: Once | INTRAVENOUS | Status: AC
Start: 2017-01-28 — End: 2017-01-28
  Administered 2017-01-28: 21:00:00 via INTRAVENOUS

## 2017-01-28 MED ORDER — ALBUMIN, HUMAN 5 % IV
5 % | INTRAVENOUS | Status: DC | PRN
Start: 2017-01-28 — End: 2017-01-28

## 2017-01-28 MED ORDER — ACETAMINOPHEN 325 MG TABLET
325 mg | ORAL | Status: DC | PRN
Start: 2017-01-28 — End: 2017-01-29

## 2017-01-28 MED ORDER — LACTATED RINGERS IV
INTRAVENOUS | Status: DC
Start: 2017-01-28 — End: 2017-01-28
  Administered 2017-01-28: 12:00:00 via INTRAVENOUS

## 2017-01-28 MED ORDER — ONDANSETRON (PF) 4 MG/2 ML INJECTION
4 mg/2 mL | INTRAMUSCULAR | Status: DC | PRN
Start: 2017-01-28 — End: 2017-01-29

## 2017-01-28 MED ORDER — PROPOFOL 10 MG/ML IV EMUL
10 mg/mL | INTRAVENOUS | Status: DC | PRN
Start: 2017-01-28 — End: 2017-01-28
  Administered 2017-01-28: 13:00:00 via INTRAVENOUS

## 2017-01-28 MED ORDER — TRAMADOL 50 MG TAB
50 mg | Freq: Four times a day (QID) | ORAL | Status: DC | PRN
Start: 2017-01-28 — End: 2017-01-29

## 2017-01-28 MED ORDER — LIDOCAINE (PF) 10 MG/ML (1 %) IJ SOLN
10 mg/mL (1 %) | INTRAMUSCULAR | Status: DC | PRN
Start: 2017-01-28 — End: 2017-01-28

## 2017-01-28 MED ORDER — MIDAZOLAM 1 MG/ML IJ SOLN
1 mg/mL | INTRAMUSCULAR | Status: DC | PRN
Start: 2017-01-28 — End: 2017-01-28
  Administered 2017-01-28: 12:00:00 via INTRAVENOUS

## 2017-01-28 MED ORDER — NEOSTIGMINE METHYLSULFATE 1 MG/ML INJECTION
1 mg/mL | INTRAMUSCULAR | Status: DC | PRN
Start: 2017-01-28 — End: 2017-01-28
  Administered 2017-01-28: 15:00:00 via INTRAVENOUS

## 2017-01-28 MED FILL — SODIUM CHLORIDE 0.45 % IV: 0.45 % | INTRAVENOUS | Qty: 1000

## 2017-01-28 MED FILL — ELECTROLYTE REPLACEMENT PROTOCOL: Qty: 1

## 2017-01-28 MED FILL — ONDANSETRON (PF) 4 MG/2 ML INJECTION: 4 mg/2 mL | INTRAMUSCULAR | Qty: 2

## 2017-01-28 MED FILL — GLYCOPYRROLATE 0.6 MG/3 ML (0.2 MG/ML) INTRAVENOUS SYRINGE: 0.6 mg/3 mL (0.2 mg/mL) | INTRAVENOUS | Qty: 2

## 2017-01-28 MED FILL — FENTANYL CITRATE (PF) 50 MCG/ML IJ SOLN: 50 mcg/mL | INTRAMUSCULAR | Qty: 2

## 2017-01-28 MED FILL — DIVALPROEX 500 MG 24 HR TAB: 500 mg | ORAL | Qty: 1

## 2017-01-28 MED FILL — NORMAL SALINE FLUSH 0.9 % INJECTION SYRINGE: INTRAMUSCULAR | Qty: 20

## 2017-01-28 MED FILL — FAMOTIDINE (PF) 20 MG/2 ML IV: 20 mg/2 mL | INTRAVENOUS | Qty: 2

## 2017-01-28 MED FILL — PROTAMINE 10 MG/ML IV: 10 mg/mL | INTRAVENOUS | Qty: 9

## 2017-01-28 MED FILL — DIPRIVAN 10 MG/ML INTRAVENOUS EMULSION: 10 mg/mL | INTRAVENOUS | Qty: 20

## 2017-01-28 MED FILL — CEFAZOLIN 2 GRAM/20 ML IN STERILE WATER INTRAVENOUS SYRINGE: 2 gram/0 mL | INTRAVENOUS | Qty: 20

## 2017-01-28 MED FILL — SODIUM CHLORIDE 0.9 % IV: INTRAVENOUS | Qty: 1000

## 2017-01-28 MED FILL — LACTATED RINGERS IV: INTRAVENOUS | Qty: 1000

## 2017-01-28 MED FILL — NEOSTIGMINE METHYLSULFATE 5 MG/5 ML (1 MG/ML) IV SYRINGE: 5 mg/ mL (1 mg/mL) | INTRAVENOUS | Qty: 3

## 2017-01-28 MED FILL — ALBUTEIN 5 % INTRAVENOUS SOLUTION: 5 % | INTRAVENOUS | Qty: 250

## 2017-01-28 MED FILL — MIDAZOLAM 1 MG/ML IJ SOLN: 1 mg/mL | INTRAMUSCULAR | Qty: 4

## 2017-01-28 MED FILL — ROCURONIUM 10 MG/ML IV: 10 mg/mL | INTRAVENOUS | Qty: 3

## 2017-01-28 MED FILL — MAGNESIUM SULFATE IN D5W 1 GRAM/100 ML IV PIGGY BACK: 1 gram/00 mL | INTRAVENOUS | Qty: 100

## 2017-01-28 MED FILL — QUELICIN 20 MG/ML INJECTION SOLUTION: 20 mg/mL | INTRAMUSCULAR | Qty: 8

## 2017-01-28 MED FILL — HEPARIN (PORCINE) 1,000 UNIT/ML IJ SOLN: 1000 unit/mL | INTRAMUSCULAR | Qty: 1

## 2017-01-28 MED FILL — PHENYLEPHRINE IN 0.9 % SODIUM CL (40 MCG/ML) IV SYRINGE: 0.4 mg/10 mL (40 mcg/mL) | INTRAVENOUS | Qty: 10

## 2017-01-28 NOTE — Anesthesia Procedure Notes (Signed)
TEE  Date/Time: 01/28/2017 9:59 AM      Procedure Details: probe placement, image aquisition & interpretation    Risks and benefits discussed with the patient and plans are to proceed    Procedure Note    Performed by: Jorge MandrilWood, Natasia Sanko D, MD  Authorized by: Jorge MandrilWood, Ercie Eliasen D, MD       Indications: assessment of surgical repair  Modalities: 2D, CF, CWD, PWD (3D)  Probe Type: multiplane  Insertion: atraumatic  Patient Status: intubated and sedated    Echocardiographic and Doppler Measurements   Aorta  Size  Diam(cm)  Dissection PlaqueThick(mm)  Plaque Mobile    Ascending normal  No 0-3 No    Arch normal  No 0-3 No    Descending normal  No 0-3 No          Valves  Annulus  Stenosis  Area/Grad  Regurg  Leaflet   Morph  Leaflet   Motion    Aortic normal none  1+ normal normal    Mitral dilated none MG=3, medial P2 flail with bileaflet prolapse 4+  prolapse, flail    Tricuspid normal none  2+ normal normal          Atria  Size  SEC (smoke)  Thrombus  Tumor  Device    Rt Atrium normal No No No Yes    Lt Atrium dilated No No No No     Interatrial Septum Morphology: normal    Interventricular Septum Morphology: normal    Ventricle  Cavity Size  Cavity Dimension Hypertrophy  Thrombus  Gloal FXN  EF    RV normal  No no normal     LV normal   No normal 50-55%       Regional Function  (1 = normal, 2 = mildly hypokinetic, 3 = severely hypokinetic, 4 = akinetic, 5 = dyskinetic) LAV - Long Axis View   ME LAV = 0  ME LAV = 90  ME LAV = 130   Basal Sept:1 Basal Ant:1 Basal Post:1   Mid Sept:1 Mid Ant:1 Mid Post:1   Apical Sept:1 Apical Ant:1 Basal Ant Sept:1   Basal Lat:1 Basal Inf:1 Mid Ant Sept:1   Mid Lat:1 Mid Inf:1    Apical Lat:1 Apical Inf:1        Pericardium: normal    Post Intervention Follow-up Study  Ventricular Global Function: unchanged  Ventricular Regional Function: unchanged     Valve  Function  Regurgitation  Area    Aortic       Mitral improved 1+     Tricuspid       Prosthetic        Complications: None  Comments:  Mitraclip x 2 clips at A2/P2 with mild residual MR with MG=3, mild bidirectional shunt at transseptal crossing with bulk of flow left to right

## 2017-01-28 NOTE — Op Note (Signed)
Mitral Clip Procedure  Operator: Dr [name] and Dr Jaynie CollinsLim  Surgeon:  Dr Reyes IvanBladergroen    Indication:  Patient was previously seen by Dr. [name] and Dr. Jaynie CollinsLim from cardiology and Dr. Reyes IvanBladergroen from cardiac surgery, who were present throughout the procedure. The patient was found to have severe degenerative mitral regurgitation in a patient at prohibitive surgical risk due to comorbid conditions (age, frailty, and STS risk > 8%).     After informed consent was obtained from the patient, the patient was brought in the fasting state to the catheterization laboratory. The patient was prepped and draped in the usual sterile fashion. The patient was sedated and anesthetized, and intubated by the anesthesia service.  Prophylactic antibiotic administration was done.  A transesophageal echocardiogram was performed.     Access  A 7 Fr sheath was placed in the right femoral vein for diagnostic right heart catheterization, obtaining hemodynamic evaluation of the pulmonary artery, wedge pressures.  A transseptal puncture was performed using TEE and fluoroscopic guidance, and then systemic heparinization was given.     Procedure  Following the right heart catheterization and transseptal puncture, a 24 French steerable guide catheter was introduced into the left atrium. Next, percutaneous transcatheter mitral valve repair was performed using MitraClip system and guided by TEE. We implanted a  MitraClip XTR and a MitraClip NTR on the A2/P2 mitral leaflet scallops with reduction of mitral regurgitation from severe to trace.     Hemodynamics  The inflow gradient by echo post-MitraClip was 4 mmHg mean by TEE.    Conclusion  1. Mitral regurgitation, degenerative, severe  2. Prohibitive surgical risk  3. Successful mitraclip procedure     At the completion of the case, catheters were removed. Hemostasis was obtained by subcutaneous stitch.  Mitral repair was successful with no complications.  Estimated blood loss: 10 mL.  ?  I was present  for the entire procedure. I have edited the procedure note as appropriate.

## 2017-01-28 NOTE — Op Note (Signed)
Mitral Clip Procedure  Operator: Dr [name]   Assistant surgeon :  Dr Reyes IvanBladergroen  ??  Indication:  Patient was previously seen by Dr. [name] and Dr. Jaynie CollinsLim from cardiology and Dr. Reyes IvanBladergroen from cardiac surgery, who were present throughout the procedure. The patient was found to have severe degenerative mitral regurgitation in a patient at prohibitive surgical risk due to comorbid conditions (age, frailty, and STS risk > 8%).     After informed consent was obtained from the patient, the patient was brought in the fasting state to the catheterization laboratory. The patient was prepped and draped in the usual sterile fashion. The patient was sedated and anesthetized, and intubated by the anesthesia service.  Prophylactic antibiotic administration was done.  A transesophageal echocardiogram was performed.     Access  A 7 Fr sheath was placed in the right femoral vein for diagnostic right heart catheterization, obtaining hemodynamic evaluation of the pulmonary artery, wedge pressures.  A transseptal puncture was performed using TEE and fluoroscopic guidance, and then systemic heparinization was given.     Procedure  Following the right heart catheterization and transseptal puncture, a 24 French steerable guide catheter was introduced into the left atrium. Next, percutaneous transcatheter mitral valve repair was performed using MitraClip system and guided by TEE. We implanted a  MitraClip XTR and a MitraClip NTR on the A2/P2 mitral leaflet scallops with reduction of mitral regurgitation from severe to trace.     Hemodynamics  The inflow gradient by echo post-MitraClip was 4 mmHg mean by TEE.  ??  Conclusion  1.         Mitral regurgitation, degenerative, severe  2.         Prohibitive surgical risk  3.         Successful mitraclip procedure     At the completion of the case, catheters were removed. Hemostasis was obtained by subcutaneous stitch.  Mitral repair was successful with no complications.  Estimated blood loss:  10 mL.

## 2017-01-28 NOTE — Anesthesia Procedure Notes (Signed)
Arterial Line Placement    Start time: 01/28/2017 7:02 AM  End time: 01/28/2017 7:09 AM  Performed by: Jorge MandrilWood, Reggie Bise D, MD  Authorized by: Jorge MandrilWood, Sheng Pritz D, MD     Pre-Procedure  Indications:  Arterial pressure monitoring  Preanesthetic Checklist: patient identified, risks and benefits discussed, anesthesia consent, site marked, patient being monitored, timeout performed and patient being monitored      Procedure:   Prep:  Chlorhexidine  Seldinger Technique?: Yes    Orientation:  Right  Location:  Radial artery  Catheter size:  20 G  Number of attempts:  1  Cont Cardiac Output Sensor: No      Assessment:   Post-procedure:  Line secured and sterile dressing applied  Patient Tolerance:  Patient tolerated the procedure well with no immediate complications

## 2017-01-28 NOTE — Op Note (Signed)
[facility]  OPERATIVE REPORT    [name]:Prichett, Ramell  MR#: [id number]  DOB: [birth year]  ACCOUNT #: [id number]   DATE OF SERVICE: 01/28/2017    PROCEDURE PERFORMED:  Mitral valve repair with a MitraClip device.    INDICATION:  Severe mitral regurgitation, symptomatic in a patient of high surgical risk.    PREOPERATIVE DIAGNOSIS:  Severe mitral regurgitation.      POSTOPERATIVE DIAGNOSIS:  Severe mitral regurgitation.    ESTIMATED BLOOD LOSS:  10 mL.    SPECIMENS REMOVED:  None.    PRIMARY OPERATORS:  Sherley BoundsJames Garnett, MD and Enzo Biavid Scott Lim, MD    SURGEON:  Ron AgeeMark R. Bladergroen, MD    ASSISTANT:  Valaria Goodobert H. Giovannie Scerbo, MD    ANESTHESIA:  general.    COMPLICATIONS:  none.    IMPLANTS:  2 mitaclips.    PROCEDURE IN DETAIL:  After informed consent was obtained, the patient was brought in the fasting state to the hybrid cath lab.  The patient was prepped and draped in the usual manner.  The patient was sedated by anesthesia services.  Prophylactic antibiotics were administered.  A transesophageal echocardiogram was performed by anesthesia services.  Following this, a 6-French sheath was placed in the right femoral vein and a Swan-Ganz catheter was placed into the sheath and into the right heart for right heart catheterization.  Following right heart catheterization, a transseptal sheath was positioned under transesophageal echo guidance in the appropriate place in the atrial septum.  A transseptal puncture was performed.  Next, a 24-French durable guide catheter was introduced into the left atrium.  Following positioning of the guide and MitraClip, and XT device was placed through the guide and positioned under fluoroscopic and TEE guidance into the ATPT position with successful MitraClip procedure.  Because of residual mitral regurgitation, a second clip NT was positioned just medial to the previously placed clip.  Repeat transesophageal echocardiogram revealed now trivial mitral regurgitation.  The sheath was  then withdrawn back.  There was no significant atrial septal defect and no right to left shunting.  The sheath was then removed and hemostasis was obtained by a subcutaneous stitch.  Procedure was successful without complication.  The patient was transferred to the recovery room in stable condition.    CONCLUSION:  Successful repair of severe mitral regurgitation in a patient of prohibitive surgical risks with 2 MitraClips.        Valaria GoodOBERT H. Navah Grondin, MD       RHL / PN  D: 01/28/2017 19:31     T: 01/29/2017 06:59  JOB #: [id number]  CC: Theodoro DoingOBERT Squire Withey MD

## 2017-01-28 NOTE — Anesthesia Procedure Notes (Signed)
Arterial Line Placement    Start time: 01/28/2017 7:02 AM  End time: 01/28/2017 7:09 AM  Performed by: Soo Steelman D, MD  Authorized by: Jameya Pontiff D, MD     Pre-Procedure  Indications:  Arterial pressure monitoring  Preanesthetic Checklist: patient identified, risks and benefits discussed, anesthesia consent, site marked, patient being monitored, timeout performed and patient being monitored      Procedure:   Prep:  Chlorhexidine  Seldinger Technique?: Yes    Orientation:  Right  Location:  Radial artery  Catheter size:  20 G  Number of attempts:  1  Cont Cardiac Output Sensor: No      Assessment:   Post-procedure:  Line secured and sterile dressing applied  Patient Tolerance:  Patient tolerated the procedure well with no immediate complications

## 2017-01-28 NOTE — Anesthesia Post-Procedure Evaluation (Signed)
Post-Anesthesia Evaluation and Assessment  Patient: David Le MRN: [id number]  SSN: [id number]    Date of Birth: [birth year]  Age: 81 y.o.  Sex: male      I have evaluated the patient and they are stable and ready for discharge from the PACU.     Cardiovascular Function/Vital Signs  Visit Vitals  BP 93/53   Pulse 65   Temp 36.7 ??C (98.1 ??F)   Resp 15   Ht 5' 7.75" (1.721 m)   Wt 83.2 kg (183 lb 7 oz)   SpO2 100%   BMI 28.10 kg/m??       Patient is status post General anesthesia for Procedure(s):  TRANSCATHETER MITRAL VALVE REPAIR.    Nausea/Vomiting: None    Postoperative hydration reviewed and adequate.    Pain:  Pain Scale 1: Numeric (0 - 10) (01/28/17 1027)  Pain Intensity 1: 0 (01/28/17 1027)   Managed    Neurological Status:   Neuro (WDL): Within Defined Limits (01/28/17 0655)   At baseline    Mental Status, Level of Consciousness: Alert and  oriented to person, place, and time    Pulmonary Status:   O2 Device: Nasal cannula (01/28/17 1027)   Adequate oxygenation and airway patent    Complications related to anesthesia: None    Post-anesthesia assessment completed. No concerns    Signed By: Jorge MandrilMatthew D Peyson Delao, MD     January 28, 2017              Procedure(s):  TRANSCATHETER MITRAL VALVE REPAIR.    <BSHSIANPOST>    Visit Vitals  BP 93/53   Pulse 65   Temp 36.7 ??C (98.1 ??F)   Resp 15   Ht 5' 7.75" (1.721 m)   Wt 83.2 kg (183 lb 7 oz)   SpO2 100%   BMI 28.10 kg/m??

## 2017-01-28 NOTE — H&P (Signed)
Date of Surgery Update:  David Le was seen and examined.  History and physical has been reviewed. The patient has been examined. There have been no significant clinical changes since the completion of the originally dated History and Physical.    Signed By: Calla KicksAbigail S Nitya Cauthon, PA     January 28, 2017 7:23 AM

## 2017-01-28 NOTE — Anesthesia Procedure Notes (Signed)
TEE  Date/Time: 01/28/2017 9:59 AM      Procedure Details: probe placement, image aquisition & interpretation    Risks and benefits discussed with the patient and plans are to proceed    Procedure Note    Performed by: Jorge MandrilWood, Chrisandra Wiemers D, MD  Authorized by: Jorge MandrilWood, Charo Philipp D, MD       Indications: assessment of surgical repair  Modalities: 2D, CF, CWD, PWD (3D)  Probe Type: multiplane  Insertion: atraumatic  Patient Status: intubated and sedated    Echocardiographic and Doppler Measurements   Aorta  Size  Diam(cm)  Dissection PlaqueThick(mm)  Plaque Mobile    Ascending normal  No 0-3 No    Arch normal  No 0-3 No    Descending normal  No 0-3 No          Valves  Annulus  Stenosis  Area/Grad  Regurg  Leaflet   Morph  Leaflet   Motion    Aortic normal none  1+ normal normal    Mitral dilated none MG=3, medial P2 flail with bileaflet prolapse 4+  prolapse, flail    Tricuspid normal none  2+ normal normal          Atria  Size  SEC (smoke)  Thrombus  Tumor  Device    Rt Atrium normal No No No Yes    Lt Atrium dilated No No No No     Interatrial Septum Morphology: normal    Interventricular Septum Morphology: normal    Ventricle  Cavity Size  Cavity Dimension Hypertrophy  Thrombus  Gloal FXN  EF    RV normal  No no normal     LV normal   No normal 50-55%       Regional Function  (1 = normal, 2 = mildly hypokinetic, 3 = severely hypokinetic, 4 = akinetic, 5 = dyskinetic) LAV - Long Axis View   ME LAV = 0  ME LAV = 90  ME LAV = 130   Basal Sept:1 Basal Ant:1 Basal Post:1   Mid Sept:1 Mid Ant:1 Mid Post:1   Apical Sept:1 Apical Ant:1 Basal Ant Sept:1   Basal Lat:1 Basal Inf:1 Mid Ant Sept:1   Mid Lat:1 Mid Inf:1    Apical Lat:1 Apical Inf:1        Pericardium: normal    Post Intervention Follow-up Study  Ventricular Global Function: unchanged  Ventricular Regional Function: unchanged     Valve  Function  Regurgitation  Area    Aortic       Mitral improved 1+     Tricuspid       Prosthetic        Complications: None   Comments: Mitraclip x 2 clips at A2/P2 with mild residual MR with MG=3, mild bidirectional shunt at transseptal crossing with bulk of flow left to right

## 2017-01-28 NOTE — Op Note (Signed)
Mitral Clip Procedure  Operator: Dr [name]   Assistant surgeon :  Dr Reyes IvanBladergroen  ??  Indication:  Patient was previously seen by Dr. [name] and Dr. Jaynie CollinsLim from cardiology and Dr. Reyes IvanBladergroen from cardiac surgery, who were present throughout the procedure. The patient was found to have severe degenerative mitral regurgitation in a patient at prohibitive surgical risk due to comorbid conditions (age, frailty, and STS risk > 8%).     After informed consent was obtained from the patient, the patient was brought in the fasting state to the catheterization laboratory. The patient was prepped and draped in the usual sterile fashion. The patient was sedated and anesthetized, and intubated by the anesthesia service.  Prophylactic antibiotic administration was done.  A transesophageal echocardiogram was performed.     Access  A 7 Fr sheath was placed in the right femoral vein for diagnostic right heart catheterization, obtaining hemodynamic evaluation of the pulmonary artery, wedge pressures.  A transseptal puncture was performed using TEE and fluoroscopic guidance, and then systemic heparinization was given.     Procedure  Following the right heart catheterization and transseptal puncture, a 24 French steerable guide catheter was introduced into the left atrium. Next, percutaneous transcatheter mitral valve repair was performed using MitraClip system and guided by TEE. We implanted a  MitraClip XTR and a MitraClip NTR on the A2/P2 mitral leaflet scallops with reduction of mitral regurgitation from severe to trace.     Hemodynamics  The inflow gradient by echo post-MitraClip was 4 mmHg mean by TEE.  ??  Conclusion  1.         Mitral regurgitation, degenerative, severe  2.         Prohibitive surgical risk  3.         Successful mitraclip procedure     At the completion of the case, catheters were removed. Hemostasis was obtained by subcutaneous stitch.  Mitral repair was successful with no complications.   Estimated blood loss: 10 mL.

## 2017-01-28 NOTE — Other (Addendum)
10:27 Fatient arrived from OR to CCU room 18, he is sleepy but oriented, family in waiting room. Right groin dressin D & I.  Primary Nurse Twanna HyMichelle L Odegard-Mitterer, RN and Albin Fellingarla, RN performed a dual skin assessment on this patient No impairment noted  Current Bed:   Total Care (foam)  Braden score is 14.      11:16 BP on lower side, will leave a line. Cuff pressures lower and equal on both sides.     13:30 Sitting up in bed, family at bedside states he feels well, vitals stable and at his baseline. Full liquid tray ordered.     14:50 Right arterial line dced.    16:00 Bedside shift change report given to Faith (Cabin crewoncoming nurse) by Marcelino DusterMichelle (offgoing nurse). Report included the following information SBAR, Kardex, OR Summary, Procedure Summary, Intake/Output, MAR, Accordion, Recent Results, Med Rec Status, Cardiac Rhythm paced, Alarm Parameters , Pre Procedure Checklist, Procedure Verification and Quality Measures.

## 2017-01-28 NOTE — Anesthesia Pre-Procedure Evaluation (Signed)
Anesthetic History   No history of anesthetic complications            Review of Systems / Medical History  Patient summary reviewed, nursing notes reviewed and pertinent labs reviewed    Pulmonary  Within defined limits                 Neuro/Psych   Within defined limits           Cardiovascular      Valvular problems/murmurs: mitral insufficiency    CHF    Pacemaker and CAD    Exercise tolerance: >4 METS     GI/Hepatic/Renal  Within defined limits              Endo/Other  Within defined limits           Other Findings              Physical Exam    Airway  Mallampati: III  TM Distance: 4 - 6 cm  Neck ROM: normal range of motion   Mouth opening: Normal     Cardiovascular    Rhythm: regular  Rate: normal    Murmur, Mitral area     Dental    Dentition: Upper partial plate, Lower dentition intact and Upper dentition intact     Pulmonary  Breath sounds clear to auscultation               Abdominal  GI exam deferred       Other Findings            Anesthetic Plan    ASA: 3  Anesthesia type: general    Monitoring Plan: Arterial line and TEE      Induction: Intravenous  Anesthetic plan and risks discussed with: Patient

## 2017-01-28 NOTE — H&P (Signed)
Date of Surgery Update:  David Le was seen and examined.  History and physical has been reviewed. The patient has been examined. There have been no significant clinical changes since the completion of the originally dated History and Physical.    Signed By: Clenton PareBenjamin J Orlando Thalmann, PA-C     January 28, 2017 7:24 AM

## 2017-01-28 NOTE — Op Note (Signed)
Mitral Clip Procedure  Operator: Dr [name] and Dr Jaynie CollinsLim  Surgeon:  Dr Reyes IvanBladergroen    Indication:  Patient was previously seen by Dr. [name] and Dr. Jaynie CollinsLim from cardiology and Dr. Reyes IvanBladergroen from cardiac surgery, who were present throughout the procedure. The patient was found to have severe degenerative mitral regurgitation in a patient at prohibitive surgical risk due to comorbid conditions (age, frailty, and STS risk > 8%).     After informed consent was obtained from the patient, the patient was brought in the fasting state to the catheterization laboratory. The patient was prepped and draped in the usual sterile fashion. The patient was sedated and anesthetized, and intubated by the anesthesia service.  Prophylactic antibiotic administration was done.  A transesophageal echocardiogram was performed.     Access  A 7 Fr sheath was placed in the right femoral vein for diagnostic right heart catheterization, obtaining hemodynamic evaluation of the pulmonary artery, wedge pressures.  A transseptal puncture was performed using TEE and fluoroscopic guidance, and then systemic heparinization was given.     Procedure  Following the right heart catheterization and transseptal puncture, a 24 French steerable guide catheter was introduced into the left atrium. Next, percutaneous transcatheter mitral valve repair was performed using MitraClip system and guided by TEE. We implanted a  MitraClip XTR and a MitraClip NTR on the A2/P2 mitral leaflet scallops with reduction of mitral regurgitation from severe to trace.     Hemodynamics  The inflow gradient by echo post-MitraClip was 4 mmHg mean by TEE.    Conclusion  1. Mitral regurgitation, degenerative, severe  2. Prohibitive surgical risk  3. Successful mitraclip procedure     At the completion of the case, catheters were removed. Hemostasis was obtained by subcutaneous stitch.  Mitral repair was successful with no complications.  Estimated blood loss: 10 mL.  ?   I was present for the entire procedure. I have edited the procedure note as appropriate.

## 2017-01-28 NOTE — Progress Notes (Addendum)
1530 Bedside and Verbal shift change report given to Faith, RN (oncoming nurse) by Marcelino DusterMichelle, RN (offgoing nurse). Report included the following information SBAR, Kardex, Procedure Summary, Intake/Output, MAR, Recent Results and Cardiac Rhythm Paced.   1930 TRANSFER - OUT REPORT:    Verbal report given to Weston Brassick, RN(name) on David Le  being transferred to [facility](unit) for routine progression of care       Report consisted of patient???s Situation, Background, Assessment and   Recommendations(SBAR).     Information from the following report(s) SBAR, Kardex, Procedure Summary, Intake/Output, MAR, Recent Results and Cardiac Rhythm Paced was reviewed with the receiving nurse.    Lines:   Peripheral IV 01/28/17 Left Arm (Active)   Site Assessment Clean, dry, & intact 01/28/2017  4:00 PM   Phlebitis Assessment 0 01/28/2017  4:00 PM   Infiltration Assessment 0 01/28/2017  4:00 PM   Dressing Status Clean, dry, & intact 01/28/2017  4:00 PM   Dressing Type Transparent 01/28/2017  4:00 PM   Hub Color/Line Status Green 01/28/2017  4:00 PM   Action Taken Open ports on tubing capped 01/28/2017  4:00 PM   Alcohol Cap Used Yes 01/28/2017  4:00 PM       Peripheral IV 01/28/17 Right Hand (Active)   Site Assessment Clean, dry, & intact 01/28/2017  4:00 PM   Phlebitis Assessment 0 01/28/2017  4:00 PM   Infiltration Assessment 0 01/28/2017  4:00 PM   Dressing Status Clean, dry, & intact 01/28/2017  4:00 PM   Dressing Type Transparent 01/28/2017  4:00 PM   Hub Color/Line Status Pink 01/28/2017  4:00 PM   Action Taken Open ports on tubing capped 01/28/2017  4:00 PM   Alcohol Cap Used Yes 01/28/2017  4:00 PM        Opportunity for questions and clarification was provided.      Patient transported with:   Social research officer, governmentMonitor  Registered Nurse  Tech

## 2017-01-28 NOTE — Progress Notes (Addendum)
Cardiac Surgery Care Coordinator- Met with the family of Dennie Moltz, introduced role of the Cardiac Surgery Co-pilot Nurse. Reviewed plan of care and day of surgery expectations. Provided family with a contact number and an update from OR.  Encouraged family to verbalize and emotional support given.  Will continue to update throughout the day.     1000-Met with Labaron Digirolamo family and Dr's. Cassell Smiles and Lim.  Update given, Encouraged family to verbalize and offered emotional support.  Reinforced Surgical waiting room instructions, family to wait in the main surgical waiting room until contacted by the nursing staff.     4- Family escorted to the bedside in CCU for brief visit, reviewed plan of care and offered emotional support.  Family will be waiting in the CCU waiting room.  Will continue to follow. Willene Hatchet, RN

## 2017-01-28 NOTE — Other (Signed)
Cardiac Rehab: Visited to provide [facility]. He has just arrived from the procedure and is sleeping. Education folder to bedside with antibiotic prophylaxis card added. Will follow to educate and enroll in Cardiac Rehab at time of discharge.Selinda Orionebra Jahree Dermody, RN

## 2017-01-28 NOTE — Progress Notes (Signed)
Underwent transcatheter mitral valve repair with two clips (XTR and NTR) and reduction in MR from severe to mild, without complication by myself and Drs. Raphael GibneyGarnett, Bladergroen, and AustinLevitt.

## 2017-01-29 ENCOUNTER — Inpatient Hospital Stay: Admit: 2017-01-29 | Payer: MEDICARE | Primary: Family Medicine

## 2017-01-29 LAB — EKG, 12 LEAD, INITIAL
Atrial Rate: 65 {beats}/min
Calculated P Axis: 54 degrees
Calculated R Axis: -144 degrees
Calculated T Axis: 68 degrees
P-R Interval: 154 ms
Q-T Interval: 480 ms
QRS Duration: 154 ms
QTC Calculation (Bezet): 499 ms
Ventricular Rate: 65 {beats}/min

## 2017-01-29 LAB — CBC WITH AUTOMATED DIFF
ABS. BASOPHILS: 0.1 10*3/uL (ref 0.0–0.1)
ABS. EOSINOPHILS: 0.2 10*3/uL (ref 0.0–0.4)
ABS. IMM. GRANS.: 0 10*3/uL (ref 0.00–0.04)
ABS. LYMPHOCYTES: 1.6 10*3/uL (ref 0.8–3.5)
ABS. MONOCYTES: 1.4 10*3/uL — ABNORMAL HIGH (ref 0.0–1.0)
ABS. NEUTROPHILS: 4.2 10*3/uL (ref 1.8–8.0)
ABSOLUTE NRBC: 0 10*3/uL (ref 0.00–0.01)
BASOPHILS: 1 % (ref 0–1)
EOSINOPHILS: 2 % (ref 0–7)
HCT: 44.1 % (ref 36.6–50.3)
HGB: 13.4 g/dL (ref 12.1–17.0)
IMMATURE GRANULOCYTES: 0 % (ref 0.0–0.5)
LYMPHOCYTES: 22 % (ref 12–49)
MCH: 32.2 PG (ref 26.0–34.0)
MCHC: 30.4 g/dL (ref 30.0–36.5)
MCV: 106 FL — ABNORMAL HIGH (ref 80.0–99.0)
MONOCYTES: 18 % — ABNORMAL HIGH (ref 5–13)
MPV: 11.7 FL (ref 8.9–12.9)
NEUTROPHILS: 56 % (ref 32–75)
NRBC: 0 PER 100 WBC
PLATELET: 108 10*3/uL — ABNORMAL LOW (ref 150–400)
RBC: 4.16 M/uL (ref 4.10–5.70)
RDW: 12.3 % (ref 11.5–14.5)
WBC: 7.4 10*3/uL (ref 4.1–11.1)

## 2017-01-29 LAB — METABOLIC PANEL, BASIC
Anion gap: 10 mmol/L (ref 5–15)
BUN/Creatinine ratio: 8 — ABNORMAL LOW (ref 12–20)
BUN: 8 MG/DL (ref 6–20)
CO2: 18 mmol/L — ABNORMAL LOW (ref 21–32)
Calcium: 8.1 MG/DL — ABNORMAL LOW (ref 8.5–10.1)
Chloride: 109 mmol/L — ABNORMAL HIGH (ref 97–108)
Creatinine: 0.95 MG/DL (ref 0.70–1.30)
GFR est AA: >60 mL/min/{1.73_m2} (ref >60)
GFR est non-AA: >60 mL/min/{1.73_m2} (ref >60)
Glucose: 91 mg/dL (ref 65–100)
Potassium: 4.5 mmol/L (ref 3.5–5.1)
Sodium: 137 mmol/L (ref 136–145)

## 2017-01-29 LAB — TYPE + CROSSMATCH
ABO/Rh(D): O POS
Antibody screen: NEGATIVE
Unit division: 0
Unit division: 0

## 2017-01-29 LAB — MAGNESIUM: Magnesium: 2.4 mg/dL (ref 1.6–2.4)

## 2017-01-29 LAB — EKG 12-LEAD
Atrial Rate: 65 {beats}/min
P Axis: 54 degrees
P-R Interval: 154 ms
Q-T Interval: 480 ms
QRS Duration: 154 ms
QTc Calculation (Bazett): 499 ms
R Axis: -144 degrees
T Axis: 68 degrees
Ventricular Rate: 65 {beats}/min

## 2017-01-29 MED ORDER — ATORVASTATIN 20 MG TAB
20 mg | Freq: Every day | ORAL | Status: DC
Start: 2017-01-29 — End: 2017-01-29
  Administered 2017-01-29: 14:00:00 via ORAL

## 2017-01-29 MED ORDER — SODIUM CHLORIDE 0.9 % IJ SYRG
Freq: Three times a day (TID) | INTRAMUSCULAR | Status: DC
Start: 2017-01-29 — End: 2017-01-29
  Administered 2017-01-29 (×2): via INTRAVENOUS

## 2017-01-29 MED ORDER — CARVEDILOL 3.125 MG TAB
3.125 mg | ORAL_TABLET | Freq: Two times a day (BID) | ORAL | 1 refills | Status: DC
Start: 2017-01-29 — End: 2017-02-04

## 2017-01-29 MED ORDER — SODIUM CHLORIDE 0.9 % IJ SYRG
INTRAMUSCULAR | Status: DC | PRN
Start: 2017-01-29 — End: 2017-01-29

## 2017-01-29 MED ORDER — CARVEDILOL 3.125 MG TAB
3.125 mg | Freq: Two times a day (BID) | ORAL | Status: DC
Start: 2017-01-29 — End: 2017-01-29
  Administered 2017-01-29: 16:00:00 via ORAL

## 2017-01-29 MED ORDER — SENNOSIDES-DOCUSATE SODIUM 8.6 MG-50 MG TAB
ORAL_TABLET | Freq: Two times a day (BID) | ORAL | 0 refills | Status: AC
Start: 2017-01-29 — End: ?

## 2017-01-29 MED FILL — OXYCODONE-ACETAMINOPHEN 5 MG-325 MG TAB: 5-325 mg | ORAL | Qty: 2

## 2017-01-29 MED FILL — LIPITOR 20 MG TABLET: 20 mg | ORAL | Qty: 1

## 2017-01-29 MED FILL — SENOKOT-S 8.6 MG-50 MG TABLET: ORAL | Qty: 1

## 2017-01-29 MED FILL — CEFAZOLIN 2 GRAM/20 ML IN STERILE WATER INTRAVENOUS SYRINGE: 2 gram/0 mL | INTRAVENOUS | Qty: 20

## 2017-01-29 MED FILL — NORMAL SALINE FLUSH 0.9 % INJECTION SYRINGE: INTRAMUSCULAR | Qty: 10

## 2017-01-29 MED FILL — DIVALPROEX 500 MG 24 HR TAB: 500 mg | ORAL | Qty: 1

## 2017-01-29 MED FILL — FAMOTIDINE (PF) 20 MG/2 ML IV: 20 mg/2 mL | INTRAVENOUS | Qty: 2

## 2017-01-29 MED FILL — CARVEDILOL 3.125 MG TAB: 3.125 mg | ORAL | Qty: 1

## 2017-01-29 MED FILL — FAMOTIDINE 20 MG TAB: 20 mg | ORAL | Qty: 1

## 2017-01-29 MED FILL — MAGNESIUM OXIDE 400 MG TAB: 400 mg | ORAL | Qty: 1

## 2017-01-29 MED FILL — CALCIUM CHLORIDE 10 % IV SOLN: 100 mg/mL (10 %) | INTRAVENOUS | Qty: 10

## 2017-01-29 MED FILL — CHILDREN'S ASPIRIN 81 MG CHEWABLE TABLET: 81 mg | ORAL | Qty: 1

## 2017-01-29 MED FILL — NORMAL SALINE FLUSH 0.9 % INJECTION SYRINGE: INTRAMUSCULAR | Qty: 20

## 2017-01-29 NOTE — Discharge Summary (Signed)
[facility] Discharge Summary     Patient ID:  David Le  [id number]  81 y.o.  [birth year]    Admit date: 01/28/2017    Discharge date: 01/29/2017     Admitting Physician: David AgeeMark R Bladergroen, MD     Referring Cardiologist:  Dr. Glendon Le    PCP:  Dr. Veva HolesJohn Le    Admitting Diagnoses: MR    Discharge Diagnoses:     Hospital Problems  Never Reviewed          Codes Class Noted POA    Mitral regurgitation ICD-10-CM: I34.0  ICD-9-CM: 424.0  01/28/2017 Unknown        Non-rheumatic mitral regurgitation ICD-10-CM: I34.0  ICD-9-CM: 424.0  12/24/2016 Yes              Discharged Condition: good    Disposition: home, see patient instructions for treatment and plan    Procedures for this admission:  Procedure(s):  TRANSCATHETER MITRAL VALVE REPAIR    Discharge Medications:      My Medications      START taking these medications      Instructions Each Dose to Equal Morning Noon Evening Bedtime   senna-docusate 8.6-50 mg per tablet  Commonly known as:  PERICOLACE    Your last dose was:      Your next dose is:          Take 1 Tab by mouth two (2) times a day.   1 Tab                    CHANGE how you take these medications      Instructions Each Dose to Equal Morning Noon Evening Bedtime   carvedilol 3.125 mg tablet  Commonly known as:  COREG  What changed:    ?? medication strength  ?? how much to take    Your last dose was:      Your next dose is:          Take 1 Tab by mouth two (2) times daily (with meals).   3.24 mg                    CONTINUE taking these medications      Instructions Each Dose to Equal Morning Noon Evening Bedtime   aspirin 325 mg tablet  Commonly known as:  ASPIRIN    Your last dose was:      Your next dose is:          Take 325 mg by mouth daily.   325 mg                 atorvastatin 20 mg tablet  Commonly known as:  LIPITOR    Your last dose was:      Your next dose is:          Take 20 mg by mouth daily.   20 mg                 divalproex ER 500 mg ER tablet  Commonly known as:  DEPAKOTE ER    Your last dose was:       Your next dose is:          Take 500 mg by mouth daily.   500 mg                 ferrous gluconate 324 mg (38 mg iron) tablet    Your last dose was:  Your next dose is:          Take  by mouth Daily (before breakfast).                  folic acid 800 mcg tablet    Your last dose was:      Your next dose is:          Take 800 mcg by mouth daily.   800 mcg                 furosemide 10 mg/mL oral solution  Commonly known as:  LASIX    Your last dose was:      Your next dose is:          Take 10 mg by mouth daily.   10 mg                 melatonin Tab tablet    Your last dose was:      Your next dose is:          Take 5 mg by mouth as needed.   5 mg                 potassium chloride 20 mEq tablet  Commonly known as:  K-DUR, KLOR-CON    Your last dose was:      Your next dose is:          Take 20 mEq by mouth two (2) times a day.   20 mEq                 TYLENOL EXTRA STRENGTH 500 mg tablet  Generic drug:  acetaminophen    Your last dose was:      Your next dose is:          Take 500 mg by mouth.   500 mg                    STOP taking these medications    lisinopril 2.5 mg tablet  Commonly known as:  PRINIVIL, ZESTRIL        spironolactone 25 mg tablet  Commonly known as:  ALDACTONE              Where to Get Your Medications      Information on where to get these meds will be given to you by the nurse or doctor.    Ask your nurse or doctor about these medications  ?? carvedilol 3.125 mg tablet  ?? senna-docusate 8.6-50 mg per tablet         HPI: 81??y.o.??Caucasian??male??with PMHx of??MR, ICM, CABG X 3 (LIMA to LAD, SVG to Cx, SVG to PDA by Dr. Cristal Le 11/2005), BiV AICD (06/2006 and generator change??[employer] CRT-D Model G141 / Serial# [id number] in??10/2013), s/p cholecystectomy, s/p inguinal hernia repair??that [facility] by Dr. Garnett??for interventional evaluation of MR.  ??  David Le has known about his MR since his CABG in 2007. ??It has been tracked serially by Dr.  Glendon Axe. ??As his most recent routine f/u he c/o fatigue and easy DOE. ??A TTE in May revealed mod-severe MR. ??During a vacation [place] the [place] in [place] he had worsening SOB/DOE. ??Today he c/o SOB/DOE w/ walking on a flat surface after about 10-15 minutes. He also c/o two pillow orthopnea and PND 2-3 times/night. ??He denies any CP, chest tightness, palpitations, lightheadedness, syncope, falls or arrythmias. ??He further denies any  cardiac hospitalization in the past 12+ months. He denies any medication changes in the past 6+ months.   ??  He is HOH with hearing aides. ??He is accompanied by his wife today. ??He is independent in his ADLs, medication administration, and medical decision making.  ??  **update: The patient has been complaining of an URI with nasal congestion. [facility] saw him in his home on 01/05/17 and prescribed nasacort and coricidin hbp. The patient reports improvement in symptoms over the past couple of days.         Hospital Course: The patient underwent a TMVr with Dr. Reyes Le, Dr. Jaynie Le and Dr. Glendon Le on 01/28/17 -see op note for details. The patient was transferred to the CCU in stable condition. He was hemodynamically stable and transferred to stepdown on POD0. After medical optimization and working with PT/OT he is being discharged home.    POD#1: 1.????MR s/p MTVr: Stable  2. CAD s/p CABG X 3 (2007): On ASA/statin. Resume low dose BB.  3. ICM s/p BiV-AICD??- generator change 2015 - ??Copy of card scanned into EMR  4. Hypotension: BP on the low side overnight, better this morning. Resume lower dose BB, monitor.   5. Dispo: DC home later today or tomorrow. Remove groin stitch.    Referral to outpatient cardiac rehab made.     Discharge Vital Signs:   Visit Vitals  BP 114/49 (BP 1 Location: Right arm, BP Patient Position: Sitting)   Pulse 64   Temp 97.7 ??F (36.5 ??C)   Resp 16   Ht 5' 7.75" (1.721 m)   Wt 184 lb 1.4 oz (83.5 kg)   SpO2 99%   BMI 28.20 kg/m??       Labs:   Recent Labs      01/29/17  0955 01/28/17  1036   WBC  --  6.0   HGB  --  13.1   HCT  --  39.2   PLT  --  117*   NA 137 142   K 4.5 4.0   BUN 8 13   CREA 0.95 0.74   GLU 91 101*   INR  --  1.1       Diagnostics: PA/lat: IMPRESSION: No pneumothorax. Lungs are clear except for slight left basilar  atelectasis which is improving.    Patient Instructions/Follow Up Care:  Discharge instructions were reviewed with the patient and family present. Questions were also answered at this time. Prescriptions and medications were reviewed. The patient has a follow up appointment with the Nurse Practitioner or Physician's Assistant on 12/10. The patient was also instructed to follow up with his primary care physician as needed. The patient and family were encouraged to call with any questions or concerns.       Signed:  Clide CliffMeredith Kerrin Markman, NP  01/29/2017  12:05 PM

## 2017-01-29 NOTE — Progress Notes (Signed)
Cardiac Surgery Care Coordinator-  Met with David Le, reviewed plan of care and discharge instructions. Encouraged continued use of the incentive spirometer.  David Le is able to pull 1500cc with good effort.  Reviewed the importance of daily temp and weight monitoring, discussed groin site care and reviewed signs and symptoms of infection.  Red reminder bracelet on right wrist, reviewed purpose of the bracelet and when to call the MD..  Reminded pt of appts and encouraged participation in the Cardiac Wellness and rehab program after discharge.  Encouraged David Le to verbalize and emotional support given. Mitraclip ID card and prophylactic antibiotic card located in discharge folder.  Reviewed purpose of the cards.  David Le is without questions or concerns at this time. Willene Hatchet, RN

## 2017-01-29 NOTE — Progress Notes (Addendum)
0730 Bedside shift change report given to Frederich ChaLynn Stevens RN (oncoming nurse) by Cynda FamiliaNick Leghart RN (offgoing nurse). Report included the following information SBAR, Kardex, OR Summary, Procedure Summary, Intake/Output, MAR, Accordion, Recent Results and Med Rec Status.       1400 IV D'Cd, tele removed. Pt prepared for discharge.    1430   DISCHARGE SUMMARY from Nurse    PATIENT INSTRUCTIONS:    After general anesthesia or intravenous sedation, for 24 hours or while taking prescription Narcotics:  ?? Limit your activities  ?? Do not drive and operate hazardous machinery  ?? Do not make important personal or business decisions  ?? Do  not drink alcoholic beverages  ?? If you have not urinated within 8 hours after discharge, please contact your surgeon on call.    Report the following to your surgeon:  ?? Excessive pain, swelling, redness or odor of or around the surgical area  ?? Temperature over 100.5  ?? Nausea and vomiting lasting longer than 4 hours or if unable to take medications  ?? Any signs of decreased circulation or nerve impairment to extremity: change in color, persistent  numbness, tingling, coldness or increase pain  ?? Any questions    What to do at Home:  Recommended activity: No lifting, Driving, or Strenuous exercise for 6 weeks,     If you experience any of the following symptoms SOB, CP, fever, bleeding, please follow up with PCP or Surgeon.    *  Please give a list of your current medications to your Primary Care Provider.    *  Please update this list whenever your medications are discontinued, doses are      changed, or new medications (including over-the-counter products) are added.    *  Please carry medication information at all times in case of emergency situations.    These are general instructions for a healthy lifestyle:    No smoking/ No tobacco products/ Avoid exposure to second hand smoke  Surgeon General's Warning:  Quitting smoking now greatly reduces serious risk to your health.     Obesity, smoking, and sedentary lifestyle greatly increases your risk for illness    A healthy diet, regular physical exercise & weight monitoring are important for maintaining a healthy lifestyle    You may be retaining fluid if you have a history of heart failure or if you experience any of the following symptoms:  Weight gain of 3 pounds or more overnight or 5 pounds in a week, increased swelling in our hands or feet or shortness of breath while lying flat in bed.  Please call your doctor as soon as you notice any of these symptoms; do not wait until your next office visit.    Recognize signs and symptoms of STROKE:    F-face looks uneven    A-arms unable to move or move unevenly    S-speech slurred or non-existent    T-time-call 911 as soon as signs and symptoms begin-DO NOT go       Back to bed or wait to see if you get better-TIME IS BRAIN.    Warning Signs of HEART ATTACK     Call 911 if you have these symptoms:  ? Chest discomfort. Most heart attacks involve discomfort in the center of the chest that lasts more than a few minutes, or that goes away and comes back. It can feel like uncomfortable pressure, squeezing, fullness, or pain.  ? Discomfort in other areas of the upper body. Symptoms  can include pain or discomfort in one or both arms, the back, neck, jaw, or stomach.  ? Shortness of breath with or without chest discomfort.  ? Other signs may include breaking out in a cold sweat, nausea, or lightheadedness.  Don't wait more than five minutes to call 911 ??? MINUTES MATTER! Fast action can save your life. Calling 911 is almost always the fastest way to get lifesaving treatment. Emergency Medical Services staff can begin treatment when they arrive ??? up to an hour sooner than if someone gets to the hospital by car.     The discharge information has been reviewed with the patient and spouse.  The patient and spouse verbalized understanding.   Discharge medications reviewed with the patient and spouse and appropriate educational materials and side effects teaching were provided.  ___________________________________________________________________________________________________________________________________

## 2017-01-29 NOTE — Progress Notes (Signed)
Reason for Admission:   Patient presents with Mitral regurgitation along with SOB and DOE.                    RRAT Score:   8-Green                  Plan for utilizing home health:      TBD; CM to await further recommendations but list provided to family.                    Likelihood of Readmission:  Low                         Transition of Care Plan:   Patient to discharge home with family support. CM met with patient and family at bedside to explain CM role and assess needs. Demographic and insurance info verified. Patient lives in a two story home with basement; three steps to enter-13 steps to second floor and 13 steps to basement. Patient lives at home with his wife and supportive children. Patient reports independence with self-care and ADLs. Patient does not use any DME. PCP verified as Dr. Early Osmond two weeks ago; preferred pharmacy is [facility] [place]. CM to monitor.     Felipe Drone, MS

## 2017-01-29 NOTE — Progress Notes (Signed)
Problem: Falls - Risk of  Goal: *Absence of Falls  Document Schmid Fall Risk and appropriate interventions in the flowsheet.  Outcome: Progressing Towards Goal  Fall Risk Interventions:   pt ambulates in room with no issues           Medication Interventions: Patient to call before getting OOB, Evaluate medications/consider consulting pharmacy, Teach patient to arise slowly    Elimination Interventions: Call light in reach, Patient to call for help with toileting needs, Toileting schedule/hourly rounds             Problem: Pressure Injury - Risk of  Goal: *Prevention of pressure injury  Document Braden Scale and appropriate interventions in the flowsheet.  Outcome: Progressing Towards Goal  Pressure Injury Interventions:      Pt repositions self as necessary  Sensory Interventions: Assess changes in LOC         Activity Interventions: Chair cushion, Increase time out of bed    Mobility Interventions: Chair cushion, HOB 30 degrees or less    Nutrition Interventions: Document food/fluid/supplement intake, Discuss nutritional consult with provider    Friction and Shear Interventions: Minimize layers, Lift sheet

## 2017-01-29 NOTE — Progress Notes (Signed)
[facility] FLOOR Progress Note    Admit Date: 01/28/2017  POD: 1 Day Post-Op      Procedure:  Procedure(s):  TRANSCATHETER MITRAL VALVE REPAIR    Subjective:   Pt seen with Dr. Reyes IvanBladergroen. Afebrile, on RA. In bed. No complaints.    Objective:     Visit Vitals  BP 96/46 (BP 1 Location: Left arm, BP Patient Position: At rest)   Pulse 66   Temp 98.1 ??F (36.7 ??C)   Resp 14   Ht 5' 7.75" (1.721 m)   Wt 184 lb 1.4 oz (83.5 kg)   SpO2 90%   BMI 28.20 kg/m??     Temp (24hrs), Avg:98.1 ??F (36.7 ??C), Min:98 ??F (36.7 ??C), Max:98.4 ??F (36.9 ??C)      Last 24hr Input/Output:    Intake/Output Summary (Last 24 hours) at 01/29/2017 0929  Last data filed at 01/29/2017 0741  Gross per 24 hour   Intake 2256.84 ml   Output 1250 ml   Net 1006.84 ml        EKG/Rhythm: paced    Oxygen: RA    CXR:   CXR Results  (Last 48 hours)               01/28/17 1040  XR CHEST PORT Final result    Impression:  IMPRESSION:    1. Slight increasing left basilar atelectasis otherwise lungs appear clear.               Narrative:  EXAM:  XR CHEST PORT       INDICATION:  Postop heart       COMPARISON:  01/07/2017       FINDINGS: A portable AP radiograph of the chest was obtained at 1033 hours. AICD   is unchanged.  There is slight atelectasis left base.Marland Kitchen.  Heart size is stable.   Mitral clip is noted in position..  Bony structures are unchanged.                    Admission Weight: Last Weight   Weight: 183 lb 6.8 oz (83.2 kg) Weight: 184 lb 1.4 oz (83.5 kg)       EXAM:  General:    Lungs:   Clear to auscultation bilaterally.   Incision:  No erythema, drainage or swelling.   Heart:  Regular rate and rhythm, S1, S2 normal, no murmur, click, rub or gallop.   Abdomen:   Soft, non-tender. Bowel sounds normal. No masses,  No organomegaly.   Extremities:  No edema. PPP   Neurologic:  Gross motor and sensory apparatus intact.     Activity:    Diet:      Lab Data Reviewed:   Recent Labs     01/28/17  1036   WBC 6.0   HGB 13.1   HCT 39.2   PLT 117*   NA 142   K 4.0   BUN 13    CREA 0.74   GLU 101*   INR 1.1         Assessment:     Active Problems:    Non-rheumatic mitral regurgitation (12/24/2016)      Mitral regurgitation (01/28/2017)             Plan/Recommendations/Medical Decision Making:     1.????MR s/p MTVr: Stable  2. CAD s/p CABG X 3 (2007): On ASA/statin. Resume low dose BB.  3. ICM s/p BiV-AICD??- generator change 2015 - ??Copy of card scanned into EMR  4. Hypotension: BP  on the low side overnight, better this morning. Resume lower dose BB, monitor.   5. Dispo: DC home later today or tomorrow. Remove groin stitch.        Signed By: Clide CliffMeredith Johathon Overturf, NP

## 2017-01-29 NOTE — Discharge Summary (Addendum)
CSS Discharge Summary     Patient ID:  David Le  [id number]  81 y.o.  02-22-1936    Admit date: 01/28/2017    Discharge date: 01/29/2017     Admitting Physician: Ron AgeeMark R Bladergroen, MD     Referring Cardiologist:  Dr. [name]    PCP:  Dr. Veva HolesJohn Siedlecki    Admitting Diagnoses: MR    Discharge Diagnoses:     Hospital Problems  Never Reviewed          Codes Class Noted POA    Mitral regurgitation ICD-10-CM: I34.0  ICD-9-CM: 424.0  01/28/2017 Unknown        Non-rheumatic mitral regurgitation ICD-10-CM: I34.0  ICD-9-CM: 424.0  12/24/2016 Yes              Discharged Condition: good    Disposition: home, see patient instructions for treatment and plan    Procedures for this admission:  Procedure(s):  TRANSCATHETER MITRAL VALVE REPAIR    Discharge Medications:      My Medications      START taking these medications      Instructions Each Dose to Equal Morning Noon Evening Bedtime   senna-docusate 8.6-50 mg per tablet  Commonly known as:  PERICOLACE    Your last dose was:      Your next dose is:          Take 1 Tab by mouth two (2) times a day.   1 Tab                    CHANGE how you take these medications      Instructions Each Dose to Equal Morning Noon Evening Bedtime   carvedilol 3.125 mg tablet  Commonly known as:  COREG  What changed:    ?? medication strength  ?? how much to take    Your last dose was:      Your next dose is:          Take 1 Tab by mouth two (2) times daily (with meals).   3.24 mg                    CONTINUE taking these medications      Instructions Each Dose to Equal Morning Noon Evening Bedtime   aspirin 325 mg tablet  Commonly known as:  ASPIRIN    Your last dose was:      Your next dose is:          Take 325 mg by mouth daily.   325 mg                 atorvastatin 20 mg tablet  Commonly known as:  LIPITOR    Your last dose was:      Your next dose is:          Take 20 mg by mouth daily.   20 mg                 divalproex ER 500 mg ER tablet  Commonly known as:  DEPAKOTE ER     Your last dose was:      Your next dose is:          Take 500 mg by mouth daily.   500 mg                 ferrous gluconate 324 mg (38 mg iron) tablet    Your last dose was:  Your next dose is:          Take  by mouth Daily (before breakfast).                  folic acid 800 mcg tablet    Your last dose was:      Your next dose is:          Take 800 mcg by mouth daily.   800 mcg                 furosemide 10 mg/mL oral solution  Commonly known as:  LASIX    Your last dose was:      Your next dose is:          Take 10 mg by mouth daily.   10 mg                 melatonin Tab tablet    Your last dose was:      Your next dose is:          Take 5 mg by mouth as needed.   5 mg                 potassium chloride 20 mEq tablet  Commonly known as:  K-DUR, KLOR-CON    Your last dose was:      Your next dose is:          Take 20 mEq by mouth two (2) times a day.   20 mEq                 TYLENOL EXTRA STRENGTH 500 mg tablet  Generic drug:  acetaminophen    Your last dose was:      Your next dose is:          Take 500 mg by mouth.   500 mg                    STOP taking these medications    lisinopril 2.5 mg tablet  Commonly known as:  PRINIVIL, ZESTRIL        spironolactone 25 mg tablet  Commonly known as:  ALDACTONE              Where to Get Your Medications      Information on where to get these meds will be given to you by the nurse or doctor.    Ask your nurse or doctor about these medications  ?? carvedilol 3.125 mg tablet  ?? senna-docusate 8.6-50 mg per tablet         HPI: 81??y.o.??Caucasian??male??with PMHx of??MR, ICM, CABG X 3 (LIMA to LAD, SVG to Cx, SVG to PDA by Dr. Cristal Deer 11/2005), BiV AICD (06/2006 and generator change??[employer] CRT-D Model G141 / Serial# [id number] in??10/2013), s/p cholecystectomy, s/p inguinal hernia repair??that is [facility] by Dr. Garnett??for interventional evaluation of MR.  ??  Mr. David Le has known about his MR since his CABG in 2007. ??It has been  tracked serially by Dr. Glendon Axe. ??As his most recent routine f/u he c/o fatigue and easy DOE. ??A TTE in May revealed mod-severe MR. ??During a vacation to the [place] in [place] he had worsening SOB/DOE. ??Today he c/o SOB/DOE w/ walking on a flat surface after about 10-15 minutes. He also c/o two pillow orthopnea and PND 2-3 times/night. ??He denies any CP, chest tightness, palpitations, lightheadedness, syncope, falls or arrythmias. ??He further denies any  cardiac hospitalization in the past 12+ months. He denies any medication changes in the past 6+ months.   ??  He is HOH with hearing aides. ??He is accompanied by his wife today. ??He is independent in his ADLs, medication administration, and medical decision making.  ??  **update: The patient has been complaining of an URI with nasal congestion. [facility] saw him in his home on 01/05/17 and prescribed nasacort and coricidin hbp. The patient reports improvement in symptoms over the past couple of days.         Hospital Course: The patient underwent a TMVr with Dr. Reyes IvanBladergroen, Dr. Jaynie CollinsLim and Dr. [name] on 01/28/17 -see op note for details. The patient was transferred to the CCU in stable condition. He was hemodynamically stable and transferred to stepdown on POD0. After medical optimization and working with PT/OT he is being discharged home.    POD#1: 1.????MR s/p MTVr: Stable  2. CAD s/p CABG X 3 (2007): On ASA/statin. Resume low dose BB.  3. ICM s/p BiV-AICD??- generator change 2015 - ??Copy of card scanned into EMR  4. Hypotension: BP on the low side overnight, better this morning. Resume lower dose BB, monitor.   5. Dispo: DC home later today or tomorrow. Remove groin stitch.    Referral to outpatient cardiac rehab made.     Discharge Vital Signs:   Visit Vitals  BP 114/49 (BP 1 Location: Right arm, BP Patient Position: Sitting)   Pulse 64   Temp 97.7 ??F (36.5 ??C)   Resp 16   Ht 5' 7.75" (1.721 m)   Wt 184 lb 1.4 oz (83.5 kg)   SpO2 99%   BMI 28.20 kg/m??        Labs:   Recent Labs     01/29/17  0955 01/28/17  1036   WBC  --  6.0   HGB  --  13.1   HCT  --  39.2   PLT  --  117*   NA 137 142   K 4.5 4.0   BUN 8 13   CREA 0.95 0.74   GLU 91 101*   INR  --  1.1       Diagnostics: PA/lat: IMPRESSION: No pneumothorax. Lungs are clear except for slight left basilar  atelectasis which is improving.    Patient Instructions/Follow Up Care:  Discharge instructions were reviewed with the patient and family present. Questions were also answered at this time. Prescriptions and medications were reviewed. The patient has a follow up appointment with the Nurse Practitioner or Physician's Assistant on 12/10. The patient was also instructed to follow up with his primary care physician as needed. The patient and family were encouraged to call with any questions or concerns.       Signed:  Clide CliffMeredith Danicka Hourihan, NP  01/29/2017  12:05 PM

## 2017-01-29 NOTE — Progress Notes (Signed)
physical Therapy EVALUATION/DISCHARGE  Patient: David Le (81 y.o. male)  Date: 01/29/2017  Primary Diagnosis: MITRAL REGURGITATION  Mitral regurgitation  Procedure(s) (LRB):  TRANSCATHETER MITRAL VALVE REPAIR (N/A) 1 Day Post-Op   Precautions:      ASSESSMENT :  Based on the objective data described below, the patient presents with overall mobility close to baseline function s/p transcatheter mitral valve repair POD1.  Chart reviewed and RN approved patient for mobility.  Patient received after ambulating the unit with nursing, agreeable to work with therapy.  Patient ambulated 17ft with CGA/SBA with steady gait and intact balance.  Patient navigated 6 stairs with CGA using 1 handrail.  VSS throughout session.  Session ended with patient sitting up in a chair with daughter present.  BP post activity 122/65, HR 83.  Patient lives in 2 story home with 3 STE with B handrails with his wife who is able to assist upon discharge.  Patient does not have any mobility needs at this point.  Will sign off and recommend patient discharge home without therapy needs when medically stable.       Skilled physical therapy is not indicated at this time.     PLAN :  Discharge Recommendations: None  Further Equipment Recommendations for Discharge: None     SUBJECTIVE:   Patient stated ???My wife is an OT.???    OBJECTIVE DATA SUMMARY:   HISTORY:    Past Medical History:   Diagnosis Date   ??? CAD (coronary artery disease)    ??? Cancer (HCC)     SKIN CA   ??? Congestive heart failure (HCC)    ??? ICD (implantable cardioverter-defibrillator) in place    ??? Murmur    ??? Pacemaker    ??? Valvular heart disease      Past Surgical History:   Procedure Laterality Date   ??? CABG, ARTERY-VEIN, THREE  2007   ??? HX APPENDECTOMY     ??? HX CORONARY ARTERY BYPASS GRAFT     ??? HX HEART CATHETERIZATION     ??? HX HEENT Bilateral     cataract extraction   ??? HX HERNIA REPAIR     ??? HX HERNIA REPAIR     ??? HX PACEMAKER  2015    AICD-GENERATOR CHANGE      Prior Level of Function/Home Situation: Independent without an AD.  Independent with ADLs.  Lives with wife in 2 story home with 3 STE.  Personal factors and/or comorbidities impacting plan of care:     Home Situation  Home Environment: Private residence  # Steps to Enter: 3  Rails to Enter: Yes  Hand Rails : Bilateral(>arms width)  One/Two Story Residence: Two story  Living Alone: No(wife)  Support Systems: Family member(s), Child(ren)  Patient Expects to be Discharged to:: Private residence  Current DME Used/Available at Home: Paediatric nurse  Tub or Shower Type: Air traffic controller    EXAMINATION/PRESENTATION/DECISION MAKING:   Critical Behavior:  Neurologic State: Alert  Orientation Level: Appropriate for age, Oriented X4  Cognition: Appropriate for age attention/concentration, Appropriate safety awareness, Decreased attention/concentration     Hearing:  Auditory  Auditory Impairment: Hard of hearing, bilateral  Hearing Aids/Status: Bilateral  Skin:    Edema:   Range Of Motion:  AROM: Within functional limits           PROM: Within functional limits           Strength:    Strength: Within functional limits  Tone & Sensation:   Tone: Normal              Sensation: Intact               Coordination:  Coordination: Within functional limits  Vision:      Functional Mobility:  Bed Mobility:              Transfers:  Sit to Stand: Stand-by assistance  Stand to Sit: Stand-by assistance                       Balance:   Sitting: Intact  Standing: Intact  Ambulation/Gait Training:  Distance (ft): 150 Feet (ft)  Assistive Device: Gait belt  Ambulation - Level of Assistance: Contact guard assistance;Stand-by assistance                 Base of Support: Narrowed     Speed/Cadence: Slow                          Stairs:  Number of Stairs Trained: 6  Stairs - Level of Assistance: Contact guard assistance;Stand-by assistance   Rail Use: Right      Therapeutic Exercises:       Functional Measure:  Barthel Index:    Bathing: 5   Bladder: 10  Bowels: 10  Grooming: 5  Dressing: 10  Feeding: 10  Mobility: 15  Stairs: 10  Toilet Use: 10  Transfer (Bed to Chair and Back): 15  Total: 100      Barthel and G-code impairment scale:  Percentage of impairment CH  0% CI  1-19% CJ  20-39% CK  40-59% CL  60-79% CM  80-99% CN  100%   Barthel Score 0-100 100 99-80 79-60 59-40 20-39 1-19   0   Barthel Score 0-20 20 17-19 13-16 9-12 5-8 1-4 0      The Barthel ADL Index: Guidelines  1. The index should be used as a record of what a patient does, not as a record of what a patient could do.  2. The main aim is to establish degree of independence from any help, physical or verbal, however minor and for whatever reason.  3. The need for supervision renders the patient not independent.  4. A patient's performance should be established using the best available evidence. Asking the patient, friends/relatives and nurses are the usual sources, but direct observation and common sense are also important. However direct testing is not needed.  5. Usually the patient's performance over the preceding 24-48 hours is important, but occasionally longer periods will be relevant.  6. Middle categories imply that the patient supplies over 50 per cent of the effort.  7. Use of aids to be independent is allowed.    Clarisa KindredMahoney, F.l., Barthel, D.W. [phone](1965). Functional evaluation: the Barthel Index. Md State Med J (14)2.  Zenaida NieceVan der MoorelandPutten, J.J.M.F, FleischmannsHobart, Ian MalkinJ.C., Margret ChanceFreeman, J.A., [place], [place].J. (1999). Measuring the change indisability after inpatient rehabilitation; comparison of the responsiveness of the Barthel Index and Functional Independence Measure. Journal of Neurology, Neurosurgery, and Psychiatry, 66(4), [phone].  Dawson BillsVan Exel, N.J.A, Scholte op [place] ThunderReimer,  W.J.M, & Koopmanschap, M.A. (2004.) Assessment of post-stroke quality of life in cost-effectiveness studies: The usefulness of the Barthel Index and the EuroQoL-5D. Quality of Life Research, 13, [phone]       G codes:   In compliance with CMS???s Claims Based Outcome Reporting, the following G-code set was chosen for  this patient based on their primary functional limitation being treated:    The outcome measure chosen to determine the severity of the functional limitation was the Barthel with a score of 100/100 which was correlated with the impairment scale.    ? Mobility - Walking and Moving Around:    [phone] - CURRENT STATUS: CH - 0% impaired, limited or restricted   G8979 - GOAL STATUS: CH - 0% impaired, limited or restricted   [id number] - D/C STATUS:  CH - 0% impaired, limited or restricted        Physical Therapy Evaluation Charge Determination   History Examination Presentation Decision-Making   HIGH Complexity :3+ comorbidities / personal factors will impact the outcome/ POC  HIGH Complexity : 4+ Standardized tests and measures addressing body structure, function, activity limitation and / or participation in recreation  LOW Complexity : Stable, uncomplicated  LOW Complexity : FOTO score of 75-100      Based on the above components, the patient evaluation is determined to be of the following complexity level: LOW     Pain:  Pain Scale 1: Numeric (0 - 10)  Pain Intensity 1: 0  Pain Location 1: Head  Activity Tolerance:   BP sitting post activity: 122/65, HR 83  Please refer to the flowsheet for vital signs taken during this treatment.  After treatment:   [x]    Patient left in no apparent distress sitting up in chair  []    Patient left in no apparent distress in bed  [x]    Call bell left within reach  [x]    Nursing notified  [x]    Family present  []    Bed alarm activated    COMMUNICATION/EDUCATION:   Communication/Collaboration:  [x]    Fall prevention education was provided and the patient/caregiver indicated understanding.  [x]    Patient/family have participated as able and agree with findings and recommendations.  []    Patient is unable to participate in plan of care at this time.   Findings and recommendations were discussed with: Occupational Therapist and Registered Nurse    Thank you for this referral.  Armando GangJennifer Cohen, PT, DPT   Time Calculation: 18 mins

## 2017-01-29 NOTE — Progress Notes (Signed)
Occupational Therapy: screen and clear    Orders received, chart reviewed, consulted with RN and physical therapist.  Patient ambulated ~150 feet and completed stairs with intact balance and no assistance.  Met with patient and confirmed he does not require assistance with ADLs and has no concerns for d/c home.  Patient does not require occupational therapy at this time.  Will complete orders.  Please place a new consult if patient declines from functional baseline.    Bailey Mech, OTR/L

## 2017-01-29 NOTE — Progress Notes (Signed)
Problem: Post-procedure Day of Transcatheter Mitral Valve Repair (TMVr)  Goal: *Hemodynamically stable  Outcome: Progressing Towards Goal  BP low  Goal: *Demonstrates progressive activity  Outcome: Progressing Towards Goal  Ambulates without DOE  Goal: *Procedure site is without bleeding and signs of infection six hours post sheath removal  Outcome: Progressing Towards Goal  Dry/intact, will remove sutures prior to d/c  Goal: Diagnostic Tests/Procedures  Outcome: Progressing Towards Goal  EKG in morning with labs  Goal: Respiratory  Outcome: Progressing Towards Goal  Clear/RA

## 2017-01-29 NOTE — Op Note (Signed)
[facility]  OPERATIVE REPORT    Name:Le, David  MR#: [id number]  DOB: 08/30/1935  ACCOUNT #: [id number]   DATE OF SERVICE: 01/28/2017    PROCEDURE PERFORMED:  Mitral valve repair with a MitraClip device.    INDICATION:  Severe mitral regurgitation, symptomatic in a patient of high surgical risk.    PREOPERATIVE DIAGNOSIS:  Severe mitral regurgitation.      POSTOPERATIVE DIAGNOSIS:  Severe mitral regurgitation.    ESTIMATED BLOOD LOSS:  10 mL.    SPECIMENS REMOVED:  None.    PRIMARY OPERATORS:  James Garnett, MD and David Scott Lim, MD    SURGEON:  Mark R. Bladergroen, MD    ASSISTANT:  Josiyah Tozzi H. Chadrick Sprinkle, MD    ANESTHESIA:  general.    COMPLICATIONS:  none.    IMPLANTS:  2 mitaclips.    PROCEDURE IN DETAIL:  After informed consent was obtained, the patient was brought in the fasting state to the hybrid cath lab.  The patient was prepped and draped in the usual manner.  The patient was sedated by anesthesia services.  Prophylactic antibiotics were administered.  A transesophageal echocardiogram was performed by anesthesia services.  Following this, a 6-French sheath was placed in the right femoral vein and a Swan-Ganz catheter was placed into the sheath and into the right heart for right heart catheterization.  Following right heart catheterization, a transseptal sheath was positioned under transesophageal echo guidance in the appropriate place in the atrial septum.  A transseptal puncture was performed.  Next, a 24-French durable guide catheter was introduced into the left atrium.  Following positioning of the guide and MitraClip, and XT device was placed through the guide and positioned under fluoroscopic and TEE guidance into the ATPT position with successful MitraClip procedure.  Because of residual mitral regurgitation, a second clip NT was positioned just medial to the previously placed clip.  Repeat transesophageal echocardiogram revealed now trivial mitral regurgitation.  The sheath was  then withdrawn back.  There was no significant atrial septal defect and no right to left shunting.  The sheath was then removed and hemostasis was obtained by a subcutaneous stitch.  Procedure was successful without complication.  The patient was transferred to the recovery room in stable condition.    CONCLUSION:  Successful repair of severe mitral regurgitation in a patient of prohibitive surgical risks with 2 MitraClips.        Rasheen Bells H. Hellon Vaccarella, MD       [facility] / PN  D: 01/28/2017 19:31     T: 01/29/2017 06:59  JOB #: [id number]  CC: Nazim Kadlec MD

## 2017-01-29 NOTE — Other (Signed)
Cardiac Rehab: Met with David Le, his wife, and 2 daughters. Teach back method used. Discussed daily monitoring of temperatures and weights, signs and symptoms of fluid retention, and when to call the surgeon. Red reminder bracelet placed on the right wrist.  Discussed purpose of bracelet, duration of wear, and when to call surgeon???s office.     Discussed Cardiac Rehab Program format, benefits, and encouraged participation. They live closer to [facility]. Initial cardiac rehab appointment scheduled for Thursday, 02/27/16 at 10 am at [facility] cardi[facility] re[facility]. Appointment information placed on the AVS. General questions answered. David Le verbalized understanding.       Will continue to follow for educational needs and enrollment in the Cardiac Rehab Program. Birder Robson, RN

## 2017-01-30 NOTE — Telephone Encounter (Signed)
Cardiac Surgery Discharge - Follow up call placed to David HoseJack Agresta. Reviewed plan of care after discharge and encouraged David Le to verbalize.  Discussed precautions and reviewed medications, patient without questions regarding medications. He reports he has not had a bowel movement, suggested that he try an over the counter laxative to help.  Encouraged continued use of the incentive spirometer.  Confirmed follow up appts and reinforced importance of wearing red reminder bracelet. David HoseJack Goree is without questions or concerns. Gavin PottersJill R Rashon Rezek, RN

## 2017-02-04 ENCOUNTER — Encounter: Attending: Nurse Practitioner | Primary: Family Medicine

## 2017-02-04 ENCOUNTER — Ambulatory Visit: Admit: 2017-02-04 | Discharge: 2017-02-04 | Payer: MEDICARE | Attending: Acute Care | Primary: Family Medicine

## 2017-02-04 DIAGNOSIS — I340 Nonrheumatic mitral (valve) insufficiency: Secondary | ICD-10-CM

## 2017-02-04 MED ORDER — CARVEDILOL 3.125 MG TAB
3.125 mg | ORAL_TABLET | Freq: Two times a day (BID) | ORAL | 1 refills | Status: AC
Start: 2017-02-04 — End: ?

## 2017-02-04 NOTE — Progress Notes (Signed)
Patient: David Le   Age: 81 y.o.     Patient Care Team:  Cheron EverySiedlecki, John A, MD as PCP - General (Family Practice)  Ron AgeeBladergroen, Mark R, MD (Cardiothoracic Surgery)  Altha HarmGarnett, James B, MD (Cardiology)    PCP: Cheron EverySiedlecki, John A, MD    Cardiologist: Dr. Sherley BoundsJames Garnett    Diagnosis/Reason for Consultation: The primary encounter diagnosis was Non-rheumatic mitral regurgitation. Diagnoses of S/P CABG x 3, S/P implantation of automatic cardioverter/defibrillator (AICD), Coronary artery disease involving native coronary artery of native heart without angina pectoris, and S/P mitral valve clip implantation were also pertinent to this visit.    Problem List:   Patient Active Problem List   Diagnosis Code   ??? Non-rheumatic mitral regurgitation I34.0   ??? Coronary artery disease involving native coronary artery of native heart without angina pectoris I25.10   ??? Ischemic cardiomyopathy I25.5   ??? S/P CABG x 3 Z95.1   ??? S/P implantation of automatic cardioverter/defibrillator (AICD) Z95.810   ??? Mitral regurgitation I34.0         HPI:  81 y.o. male with PMHx of CAD with CABG x 3, AICD implantation, and Ischemic Cardiomyopathy that is [facility] by Dr. [name] for interventional evaluation of  His mitral regurgitation. [name] returns to clinic today for his first follow up after his mitra clip by [name], [name] and PaauiloBladergroen on 01-28-2017. He had an uneventful post operative course and was discharged on Day #1.    He feels better. He walked out at the mall yesterday. He denies SOB, chest pain, palpitations, and leg edema.     NYHA Classification: Class I   Class I (Mild): No limitation of physical activity. Ordinary physical activity does not cause undue fatigue, palpitation, or dyspnea.     Angina Classification: Class 0   Class 0: No symptoms       Past Medical History:   Diagnosis Date   ??? CAD (coronary artery disease)    ??? Cancer (HCC)     SKIN CA   ??? Congestive heart failure (HCC)     ??? ICD (implantable cardioverter-defibrillator) in place    ??? Murmur    ??? Pacemaker    ??? Valvular heart disease          Past Surgical History:   Procedure Laterality Date   ??? CABG, ARTERY-VEIN, THREE  2007   ??? HX APPENDECTOMY     ??? HX CORONARY ARTERY BYPASS GRAFT     ??? HX HEART CATHETERIZATION     ??? HX HEENT Bilateral     cataract extraction   ??? HX HERNIA REPAIR     ??? HX HERNIA REPAIR     ??? HX PACEMAKER  2015    AICD-GENERATOR CHANGE      Social History     Tobacco Use   ??? Smoking status: Never Smoker   ??? Smokeless tobacco: Never Used   Substance Use Topics   ??? Alcohol use: Yes     Comment: OCCASIONAL      No family history on file.  Prior to Admission medications    Medication Sig Start Date End Date Taking? Authorizing Provider   carvedilol (COREG) 3.125 mg tablet Take 1 Tab by mouth two (2) times daily (with meals). 01/29/17  Yes Clide CliffNewton, Meredith, NP   divalproex ER (DEPAKOTE ER) 500 mg ER tablet Take 500 mg by mouth daily.   Yes Provider, Historical   aspirin (ASPIRIN) 325 mg tablet  Take 325 mg by mouth daily.   Yes Provider, Historical   atorvastatin (LIPITOR) 20 mg tablet Take 20 mg by mouth daily.   Yes Provider, Historical   ferrous gluconate 324 mg (38 mg iron) tablet Take  by mouth Daily (before breakfast).   Yes Provider, Historical   folic acid 800 mcg tablet Take 800 mcg by mouth daily.   Yes Provider, Historical   furosemide (LASIX) 10 mg/mL oral solution Take 10 mg by mouth daily.   Yes Provider, Historical   potassium chloride (K-DUR, KLOR-CON) 20 mEq tablet Take 20 mEq by mouth two (2) times a day.   Yes Provider, Historical   melatonin tab tablet Take 5 mg by mouth as needed.   Yes Provider, Historical   senna-docusate (PERICOLACE) 8.6-50 mg per tablet Take 1 Tab by mouth two (2) times a day. 01/29/17   Clide CliffNewton, Meredith, NP   acetaminophen (TYLENOL EXTRA STRENGTH) 500 mg tablet Take 500 mg by mouth.    Provider, Historical       Allergies   Allergen Reactions    ??? Mold Shortness of Breath and Runny Nose   ??? Pcn [Penicillins] Unknown (comments)     PATIENT STATES MOLD ALLERGY-NOT SURE HE EVER HAD REACTION WHEN GIVEN PENICILLIN       Current Medications:   Current Outpatient Medications   Medication Sig Dispense Refill   ??? carvedilol (COREG) 3.125 mg tablet Take 1 Tab by mouth two (2) times daily (with meals). 30 Tab 1   ??? divalproex ER (DEPAKOTE ER) 500 mg ER tablet Take 500 mg by mouth daily.     ??? aspirin (ASPIRIN) 325 mg tablet Take 325 mg by mouth daily.     ??? atorvastatin (LIPITOR) 20 mg tablet Take 20 mg by mouth daily.     ??? ferrous gluconate 324 mg (38 mg iron) tablet Take  by mouth Daily (before breakfast).     ??? folic acid 800 mcg tablet Take 800 mcg by mouth daily.     ??? furosemide (LASIX) 10 mg/mL oral solution Take 10 mg by mouth daily.     ??? potassium chloride (K-DUR, KLOR-CON) 20 mEq tablet Take 20 mEq by mouth two (2) times a day.     ??? melatonin tab tablet Take 5 mg by mouth as needed.     ??? senna-docusate (PERICOLACE) 8.6-50 mg per tablet Take 1 Tab by mouth two (2) times a day. 60 Tab 0   ??? acetaminophen (TYLENOL EXTRA STRENGTH) 500 mg tablet Take 500 mg by mouth.         Vitals: Blood pressure 110/68, pulse 74, temperature 97.6 ??F (36.4 ??C), temperature source Oral, resp. rate 16, height 5' 7.75" (1.721 m), weight 179 lb 8 oz (81.4 kg), SpO2 98 %.       Allergies: is allergic to mold and pcn [penicillins].    Review of Systems: Pertinent Positives per HPI   []  Unable to obtain  ROS due to  [] mental status change  [] sedated   [] intubated   [x] Total of 13 systems reviewed as follows:  Constitutional: Negative fever, negative chills  Eyes:   Negative for amauroses fugax  ENT:   Negative sore throat,oral absecess  Endocrine Negative for thyroid replacement Rx; goiter; DM  Respiratory:  Negative chronic cough,sputum production  Cards:   Negative for  palpitations, lower extremity edema, varicosities, claudication   GI:   Negative for dysphagia, bleeding, nausea, vomiting, diarrhea, and abdominal pain  Genitourinary: Negative for frequency, dysuria,  BPH   Integument:  Negative for rash and pruritus  Hematologic:  Negative for easy bruising; bleeding dyscarsia  Musculoskel: Negative for muscle weakness inhibiting ambulation  Neurological:  Negative for stroke, TIA, syncope, dizziness  Behavl/Psych: Negative for feelings of anxiety, depression     Cardiovascular Testing:   EKG: 01-29-2017 ventricular pacing        Physical Exam:  General: Well nourished well groomed male appearing stated age accompanied by his wife  Neuro: A&OX3. MAE. Steady unassisted gait  Head:Normocephalic. Atraumatic. Symmetrical  Neck: Trachea Midline  Resp: CTA B. No Adv BS/cough/sputum/tachypnea with seated conversation  CV: S1S2 RRR. No MRGs, No JVD/carotid bruits. Pink/warm/dry extremities. No LE peripheral edema  ZO:[id number] ab. Soft. NT/ND. Active BS  GU: Voids  Integ: No obvious s/s of infection or breakdown  Musculo/Skeletal: FROM in all major joints. Age appropriate muscle tone    Clinic Evaluation:   KCCQ-12: scanned into EMR      Assessment/Plan:   1.????MR s/p MTVr: Stable post implantation of mitraclip. Will continue to keep low dose Coreg and hold ACE. Will check BP in 1 week and touch base with me for recommendations if high. Ordered echo prior to one month visit. He may have echo at visit with Dr. Glendon Axe prior to but I ordered here in case the timing doesn't work  2. CAD s/p CABG X 3 (2007): On ASA/statin, BB  3. ICM s/p BiV-AICD??- generator change 2015 - ??Copy of card scanned into EMR  4.??Hypotension:??low dose Coreg, continue

## 2017-02-04 NOTE — Addendum Note (Signed)
Addended by: Sallyanne HaversERR, DEBORAH P on: 02/09/2017 09:40 AM     Modules accepted: Level of Service

## 2017-02-26 ENCOUNTER — Inpatient Hospital Stay: Admit: 2017-02-26 | Payer: MEDICARE | Primary: Family Medicine

## 2017-02-26 DIAGNOSIS — Z954 Presence of other heart-valve replacement: Secondary | ICD-10-CM

## 2017-02-26 NOTE — Telephone Encounter (Signed)
Mr. David Le and his wife saw Dr. [name] yesterday and are requesting not to come in for his one month appt. He is a NYHA Class 1 and he completed his KCCQ-12 questionnaire. I have scanned this is the media section and will request his echo from Dr. Janeece AgeeGarnett's office for review with Dr. Jaynie CollinsLim next week.

## 2017-02-26 NOTE — Other (Signed)
David Le DOB Mar 24, 2037presented for cardiac rehab orientation and exercise tolerance test today with a primary diagnosis of s/p mitral valve clip.   Pt has history of  CABG, AICD. LVEF is 60%.  Cardiac risk factors include: age, gender, HTN, HLD.   CAD risk factors were reviewed with patient. Pt is  married and lives with his wife.  His 3 adult children live in the surrounding [place] area. Pt is retired.  Depression score PHQ9 is 2 and this is considered normal. The result was discussed with patient who affirms score to be accurate.   Patient denied chest pain or SOB during 6 minute walk on treadmill at 2.2 mph, with RPE 13. Pt's exercise regimen  has been weight training, not aerobic.  Cardiac rhythm was 95% ventricular pacing, rare native beats, 5% AV paced without ectopy. Exercise plan was developed.   Pt will attend cardiac rehab 2-3 times per week. Pt verbalized plan to engage in home exercise of aerobic activity (after 5 cardiac rehab visits-to understand target heart rate and BORG)  Patient given cardiac rehab book.  Book reviewed with patient.  Reine JustNancy Anisten Tomassi

## 2017-02-27 ENCOUNTER — Inpatient Hospital Stay: Admit: 2017-02-27 | Payer: MEDICARE | Primary: Family Medicine

## 2017-03-02 ENCOUNTER — Encounter: Attending: Thoracic Surgery (Cardiothoracic Vascular Surgery) | Primary: Family Medicine

## 2017-03-02 ENCOUNTER — Encounter: Attending: Nurse Practitioner | Primary: Family Medicine

## 2017-03-03 ENCOUNTER — Inpatient Hospital Stay: Admit: 2017-03-03 | Payer: MEDICARE | Primary: Family Medicine

## 2017-03-04 ENCOUNTER — Inpatient Hospital Stay: Admit: 2017-03-04 | Payer: MEDICARE | Primary: Family Medicine

## 2017-03-04 ENCOUNTER — Telehealth

## 2017-03-04 NOTE — Telephone Encounter (Signed)
I received the TTE from Dr. [name] performed on 02-23-2017.   EF 55%, Grade 1 Diastolic Dysfunction, Mild to mod MR, mild Aortic Regurg, Mild to Mod TR.  This is post mitraclip.  Patient and wife requested not to be seen in the office for his one month follow up since they saw Dr. [name]. He is doing well.

## 2017-03-05 ENCOUNTER — Inpatient Hospital Stay: Admit: 2017-03-05 | Payer: MEDICARE | Primary: Family Medicine

## 2017-03-09 ENCOUNTER — Inpatient Hospital Stay: Admit: 2017-03-09 | Payer: MEDICARE | Primary: Family Medicine

## 2017-03-10 ENCOUNTER — Inpatient Hospital Stay: Admit: 2017-03-10 | Payer: MEDICARE | Primary: Family Medicine

## 2017-03-12 ENCOUNTER — Inpatient Hospital Stay: Admit: 2017-03-12 | Payer: MEDICARE | Primary: Family Medicine

## 2017-03-16 ENCOUNTER — Inpatient Hospital Stay: Admit: 2017-03-16 | Payer: MEDICARE | Primary: Family Medicine

## 2017-03-17 ENCOUNTER — Inpatient Hospital Stay: Admit: 2017-03-17 | Payer: MEDICARE | Primary: Family Medicine

## 2017-03-18 ENCOUNTER — Inpatient Hospital Stay: Admit: 2017-03-18 | Payer: MEDICARE | Primary: Family Medicine

## 2017-03-23 ENCOUNTER — Inpatient Hospital Stay: Admit: 2017-03-23 | Payer: MEDICARE | Primary: Family Medicine

## 2017-03-24 ENCOUNTER — Inpatient Hospital Stay: Admit: 2017-03-25 | Payer: MEDICARE | Primary: Family Medicine

## 2017-03-25 ENCOUNTER — Inpatient Hospital Stay: Admit: 2017-03-25 | Payer: MEDICARE | Primary: Family Medicine

## 2017-03-30 ENCOUNTER — Inpatient Hospital Stay: Admit: 2017-03-30 | Payer: MEDICARE | Primary: Family Medicine

## 2017-03-30 DIAGNOSIS — Z954 Presence of other heart-valve replacement: Secondary | ICD-10-CM

## 2017-03-31 ENCOUNTER — Inpatient Hospital Stay: Admit: 2017-03-31 | Payer: MEDICARE | Primary: Family Medicine

## 2017-04-01 ENCOUNTER — Inpatient Hospital Stay: Admit: 2017-04-01 | Payer: MEDICARE | Primary: Family Medicine

## 2017-04-06 ENCOUNTER — Inpatient Hospital Stay: Admit: 2017-04-06 | Payer: MEDICARE | Primary: Family Medicine

## 2017-04-07 ENCOUNTER — Inpatient Hospital Stay: Admit: 2017-04-07 | Payer: MEDICARE | Primary: Family Medicine

## 2017-04-08 ENCOUNTER — Inpatient Hospital Stay: Admit: 2017-04-08 | Payer: MEDICARE | Primary: Family Medicine

## 2017-04-13 ENCOUNTER — Inpatient Hospital Stay: Admit: 2017-04-13 | Payer: MEDICARE | Primary: Family Medicine

## 2017-04-14 ENCOUNTER — Inpatient Hospital Stay: Admit: 2017-04-14 | Payer: MEDICARE | Primary: Family Medicine

## 2017-04-15 ENCOUNTER — Inpatient Hospital Stay: Admit: 2017-04-15 | Payer: MEDICARE | Primary: Family Medicine

## 2017-04-27 ENCOUNTER — Inpatient Hospital Stay: Admit: 2017-04-27 | Payer: MEDICARE | Primary: Family Medicine

## 2017-04-27 DIAGNOSIS — Z954 Presence of other heart-valve replacement: Secondary | ICD-10-CM

## 2017-04-28 ENCOUNTER — Inpatient Hospital Stay: Admit: 2017-04-28 | Payer: MEDICARE | Primary: Family Medicine

## 2017-04-29 ENCOUNTER — Inpatient Hospital Stay: Admit: 2017-04-29 | Payer: MEDICARE | Primary: Family Medicine

## 2017-05-04 ENCOUNTER — Inpatient Hospital Stay: Admit: 2017-05-04 | Payer: MEDICARE | Primary: Family Medicine

## 2017-05-05 ENCOUNTER — Inpatient Hospital Stay: Admit: 2017-05-05 | Payer: MEDICARE | Primary: Family Medicine

## 2017-05-06 ENCOUNTER — Inpatient Hospital Stay: Admit: 2017-05-06 | Payer: MEDICARE | Primary: Family Medicine

## 2017-05-11 ENCOUNTER — Inpatient Hospital Stay: Admit: 2017-05-11 | Payer: MEDICARE | Primary: Family Medicine

## 2017-05-12 ENCOUNTER — Inpatient Hospital Stay: Admit: 2017-05-12 | Payer: MEDICARE | Primary: Family Medicine

## 2017-05-13 ENCOUNTER — Inpatient Hospital Stay: Admit: 2017-05-13 | Payer: MEDICARE | Primary: Family Medicine

## 2017-05-25 ENCOUNTER — Inpatient Hospital Stay: Admit: 2017-05-25 | Payer: MEDICARE | Primary: Family Medicine

## 2017-05-25 DIAGNOSIS — Z954 Presence of other heart-valve replacement: Secondary | ICD-10-CM

## 2017-05-26 ENCOUNTER — Inpatient Hospital Stay: Admit: 2017-05-26 | Payer: MEDICARE | Primary: Family Medicine

## 2017-05-27 ENCOUNTER — Inpatient Hospital Stay: Admit: 2017-05-27 | Payer: MEDICARE | Primary: Family Medicine

## 2017-06-02 ENCOUNTER — Inpatient Hospital Stay: Admit: 2017-06-02 | Payer: MEDICARE | Primary: Family Medicine

## 2017-06-02 NOTE — Other (Signed)
Cardiac Rehab:  David HoseJack Gunner completed phase II cardiac rehab and attended 36 sessions from 02/26/17 to 06/02/17.   David Le is interested in maintaining optimal health and will work with [name] and his PCP. Patient has improved his endurance and stamina through regular exercise. He was able to exercise for 55 min at peak METS 3.8. Recovery blood pressure was 134/79 and was WNL.   Pt has also improved his nutrition, [employer] and depression scores; post-assessments were reviewed with patient.       David HoseJack Saxer plans to continue exercising at [employer] and has set revised goals that include cardio equipment, light weights and walking 3 to 5 times a week.

## 2018-02-26 ENCOUNTER — Ambulatory Visit: Admit: 2018-02-26 | Discharge: 2018-02-26 | Payer: MEDICARE | Attending: Nurse Practitioner | Primary: Family Medicine

## 2018-02-26 ENCOUNTER — Ambulatory Visit: Attending: Nurse Practitioner | Primary: Family Medicine

## 2018-02-26 DIAGNOSIS — I340 Nonrheumatic mitral (valve) insufficiency: Secondary | ICD-10-CM

## 2018-02-26 NOTE — Progress Notes (Signed)
Patient: David Le   Age: 83 y.o.     Patient Care Team:  Cheron Every, MD as PCP - General (Family Practice)  Ron Agee, MD (Cardiothoracic Surgery)  Altha Harm, MD (Cardiology)    PCP: Cheron Every, MD    Cardiologist: Glendon Axe    Diagnosis/Reason for Consultation: The primary encounter diagnosis was Non-rheumatic mitral regurgitation. Diagnoses of S/P mitral valve clip implantation, S/P CABG x 3, S/P implantation of automatic cardioverter/defibrillator (AICD), Ischemic cardiomyopathy, Coronary artery disease involving native coronary artery of native heart without angina pectoris, and Dyspnea, unspecified type  were also pertinent to this visit.    Problem List:   Patient Active Problem List   Diagnosis Code   ??? Non-rheumatic mitral regurgitation I34.0   ??? Coronary artery disease involving native coronary artery of native heart without angina pectoris I25.10   ??? Ischemic cardiomyopathy I25.5   ??? S/P CABG x 3 Z95.1   ??? S/P implantation of automatic cardioverter/defibrillator (AICD) Z95.810   ??? Mitral regurgitation I34.0      HPI: 83 y.o. Caucasian male s/p TMVr (MitraClip XTR and a MitraClip NTR??on the A2/P2 mitral leaflet scallops with reduction of mitral regurgitation from severe to trace)  for severe and symptomatic MR on 01/28/2017. He refused his 30 day post-op eval but is here today for his one yr eval per TVT Registry Guidelines.     David Le states he is doing ok but c/o considerable fatigue and DOE. He and his wife went on a [place] cruise this fall and he had trouble on the walking tours d/t DOE and fatigue and states this has only gotten worse over the past month or two.  He and his wife had been going to the gym three times/week but d/t his sx, they have stopped that as well.  He denies any changes in his wt (with the exception of gaining 5 lbs while traveling but has since lost this) and monitors this daily. He further denies any changes in his medications in the past  6 months and states he hasn't taken his diuretic in about that same time.  He denies any LE or abdominal edema.  He c/o some orthopnea and PND that have worsened over the past couple of months but also reports ongoing nasal congestion that he has tried nasal sprays, abx and allergy medicines to treat w/o any success.  He denies any cough, sputum or fever.  He further denies any CP, chest tightness, lightheadedness, syncope, falls or palpitations.     He had an infected tooth root that required extensive oral surgery to treat. It is now healed and he has completed the requisite abx for that.  He denies any hospital admissions since his TMVr.  He currently takes a full ASA and denies any blood/blackness/tarriness of his stools. He also reports a good appetite.     He is accompanied by his wife of 55 yrs today.  She is the one that is more vocal and forthcoming about his DOE and fatigue. He has B hearing aids. He is indepdenet in every way and does not use any DME.     PMHx of MR, ICM, CABG X 3 (LIMA to LAD, SVG to Cx, SVG to PDA by Dr. Cristal Deer 11/2005), BiV AICD (06/2006 and generator change [name] [employer] CRT-D Model G141 / Serial# [id number] in 10/2013), s/p cholecystectomy, s/p inguinal hernia repair that is referred to [facility] by Dr. Glendon Axe for interventional evaluation of  MR.  ??  NYHA Classification: II   Class II (Mild): Slight limitation of physical activity. Comfortable at rest, but ordinary physical activity results in fatigue, palpitation, or dyspnea.     Angina Classification: 0   Class 0: No symptoms    Past Medical History:   Diagnosis Date   ??? CAD (coronary artery disease)    ??? Cancer (HCC)     SKIN CA   ??? Congestive heart failure (HCC)    ??? ICD (implantable cardioverter-defibrillator) in place    ??? Murmur    ??? Pacemaker    ??? Sleep apnea     does not use CPAP   ??? Valvular heart disease        Past Surgical History:   Procedure Laterality Date   ??? CABG, ARTERY-VEIN, THREE  2007    ??? HX APPENDECTOMY     ??? HX CORONARY ARTERY BYPASS GRAFT     ??? HX HEART CATHETERIZATION     ??? HX HEENT Bilateral     cataract extraction   ??? HX HERNIA REPAIR     ??? HX HERNIA REPAIR     ??? HX PACEMAKER  2015    AICD-GENERATOR CHANGE      Social History     Tobacco Use   ??? Smoking status: Never Smoker   ??? Smokeless tobacco: Never Used   Substance Use Topics   ??? Alcohol use: Yes     Comment: OCCASIONAL- gin; wine      Family History   Problem Relation Age of Onset   ??? Heart Disease Father    ??? Lung Disease Father    ??? Alcohol abuse Brother      Prior to Admission medications    Medication Sig Start Date End Date Taking? Authorizing Provider   multivitamin (ONE A DAY) tablet Take 1 Tab by mouth daily.   Yes Provider, Historical   carvedilol (COREG) 3.125 mg tablet Take 1 Tab by mouth two (2) times daily (with meals). 02/04/17  Yes Lilia Pro, NP   divalproex ER (DEPAKOTE ER) 500 mg ER tablet Take 500 mg by mouth daily.   Yes Provider, Historical   aspirin (ASPIRIN) 325 mg tablet Take 325 mg by mouth daily.   Yes Provider, Historical   atorvastatin (LIPITOR) 20 mg tablet Take 20 mg by mouth daily.   Yes Provider, Historical   ferrous gluconate 324 mg (38 mg iron) tablet Take  by mouth Daily (before breakfast).   Yes Provider, Historical   folic acid 800 mcg tablet Take 800 mcg by mouth daily.   Yes Provider, Historical   potassium chloride (K-DUR, KLOR-CON) 20 mEq tablet Take 20 mEq by mouth two (2) times a day.   Yes Provider, Historical   melatonin tab tablet Take 5 mg by mouth as needed.   Yes Provider, Historical   ferrous sulfate 324 mg (65 mg iron) tablet Take 324 mg by mouth Daily (before breakfast).    Provider, Historical   senna-docusate (PERICOLACE) 8.6-50 mg per tablet Take 1 Tab by mouth two (2) times a day. 01/29/17   Clide Cliff, NP   acetaminophen (TYLENOL EXTRA STRENGTH) 500 mg tablet Take 500 mg by mouth.    Provider, Historical   furosemide (LASIX) 10 mg/mL oral solution Take 10 mg by mouth  daily.  02/26/18  Provider, Historical       Allergies   Allergen Reactions   ??? Mold Shortness of Breath and Runny Nose   ??? Pcn [  Penicillins] Unknown (comments)     PATIENT STATES MOLD ALLERGY-NOT SURE HE EVER HAD REACTION WHEN GIVEN PENICILLIN       Current Medications:   Current Outpatient Medications   Medication Sig Dispense Refill   ??? multivitamin (ONE A DAY) tablet Take 1 Tab by mouth daily.     ??? carvedilol (COREG) 3.125 mg tablet Take 1 Tab by mouth two (2) times daily (with meals). 30 Tab 1   ??? divalproex ER (DEPAKOTE ER) 500 mg ER tablet Take 500 mg by mouth daily.     ??? aspirin (ASPIRIN) 325 mg tablet Take 325 mg by mouth daily.     ??? atorvastatin (LIPITOR) 20 mg tablet Take 20 mg by mouth daily.     ??? ferrous gluconate 324 mg (38 mg iron) tablet Take  by mouth Daily (before breakfast).     ??? folic acid 800 mcg tablet Take 800 mcg by mouth daily.     ??? potassium chloride (K-DUR, KLOR-CON) 20 mEq tablet Take 20 mEq by mouth two (2) times a day.     ??? melatonin tab tablet Take 5 mg by mouth as needed.     ??? ferrous sulfate 324 mg (65 mg iron) tablet Take 324 mg by mouth Daily (before breakfast).     ??? senna-docusate (PERICOLACE) 8.6-50 mg per tablet Take 1 Tab by mouth two (2) times a day. 60 Tab 0   ??? acetaminophen (TYLENOL EXTRA STRENGTH) 500 mg tablet Take 500 mg by mouth.         Vitals: Blood pressure 110/72, pulse 69, temperature 97.6 ??F (36.4 ??C), temperature source Oral, resp. rate 18, height 5\' 10"  (1.778 m), weight 181 lb (82.1 kg), SpO2 96 %.     Pre 6 in walk VS: HR 69, sat 96% on RA    Allergies: is allergic to mold and pcn [penicillins].    Review of Systems: Pertinent Positives per HPI   [x] ?Total of 13 systems reviewed as follows:  Constitutional: Negative fever, negative chills  Eyes:               Negative for amauroses fugax  ENT:                Negative sore throat,oral absecess  Endocrine        Negative for thyroid replacement Rx; goiter; DM  Respiratory:     Negative chronic  cough,sputum production  Cards:              Negative for palpitations, lower extremity edema, varicosities, claudication  GI:                   Negative for dysphagia, bleeding, nausea, vomiting, diarrhea, and abdominal pain  Genitourinary: Negative for frequency, dysuria  Integument:     Negative for rash and pruritus  Hematologic:   Negative for easy bruising; bleeding dyscarsia  Musculoskel:   Negative for muscle weakness inhibiting ambulation  Neurological:   Negative for stroke, TIA, syncope, dizziness  Behavl/Psych: Negative for feelings of anxiety, depression     Cardiovascular Testing:   TTE 12/24/2016 (pre-op):  Left ventricle: Systolic function was normal. Ejection fraction was estimated in the range of 55 % to 60 %. There was mild hypokinesis of the apical anterior wall(s). Left atrium: The atrium was mildly dilated. Mitral valve: There was a mild prolapse involving the anterior and posterior leaflets. There was moderate to severe regurgitation. Tricuspid valve: There was mild to moderate regurgitation. ??  LEFT  VENTRICLE: Size was normal. Systolic function was normal. Ejection fraction was estimated in the range of 55 % to 60 %. There was mild hypokinesis of the apical anterior wall(s). RIGHT VENTRICLE: The size was normal. Systolic function was normal. A pacing wire was present. LEFT ATRIUM: The atrium was mildly dilated. ?? ATRIAL SEPTUM: Not well visualized. RIGHT ATRIUM: Size was at the upper limits of normal. A  pacing wire was present. MITRAL VALVE: There was mild thickening. There was a mild prolapse involving the anterior and posterior leaflets. DOPPLER: There was moderate to severe regurgitation. ??AORTIC VALVE: Not well visualized. DOPPLER: There was no stenosis. Therewas mild regurgitation. TRICUSPID VALVE: Not well visualized. DOPPLER: There was mild to moderate regurgitation. There was no pulmonary hypertension. PULMONIC VALVE: Not well visualized. ??AORTA: The root exhibited normal size.  PERICARDIUM: There was no pericardial effusion.   MR Vmax: 4.8 m/s  MR maxPG: 90.4 mmHg  MV VTI: 29.1 cm  MV Vmax: 1 m/s  MV Vmean: 0.5 m/s  MV maxPG: 3.9 mmHg  MV meanPG: 1.3 mmHg  MVA By PHT: 3.6 cm2  PAEDP: 15.5 mmHg  RVSP: 36.3 mmHg    TTE (01/28/2018 - done at [facility]. Report scanned into EMR): 60% LVEF, RV wnl, RVSP estimated 36 mmHg, mod MR, mild TR, mild AI    Physical Exam:  General: Well nourished well groomed gentleman appearing stated age accompanied by his wife  Neuro: A&OX3. MAE. PERRL. Steady un-assisted gait  Head:Normocephalic. Atraumatic. Symmetrical  Neck: Trachea Midline  Resp: CTA B. No Adv BS/cough/sputum/tachypnea with seated conversation  CV: S1S2 RRR. SEM III/VI. No JVD/carotid bruits. Pink/warm/dry extremities. No LE peripheral edema  XB:[id number]:Benign ab. Soft. NT/ND. Active BS  GU: Voids  Integ: No obvious s/s of infection or breakdown  Musculo/Skeletal: FROM in all major joints. Good muscle tone    Clinic Evaluation:   KCCQ-12: scanned into EMR    6 minute walk test (pre-op): PreTest HR/02 sat:  See VS this visit - completed 6 min                                   Post Test HR/02 sat: 70 / 100% sat on RA                                   Distance Walked: 812 ft 5 inches    6 minute walk test today: PreTest HR/02 sat:  See VS this visit - completed 6 min             Post Test HR/02 sat: 79 / 96% on RA             Distance Walked: 776 ft 6 inches    Assessment/Plan:   1. MR s/p TMVr (MitraClip XTR and a MitraClip NTR??on the A2/P2 mitral leaflet scallops with reduction of mitral regurgitation from severe to trace)  for severe and symptomatic MR on 01/28/2017 - Inflow gradient at end of MitraClip case 4mmHg by TEE. No MV gradient noted in most recent TTE done at outside facility. ASA 81mg  daily only needed for valve patency but pt on full dose ASA.  SBE prophylax reviewed. Pt with worsening fatigue and DOE. Only with low dose BB. May obtain some sx benefit from GDMT. Pt referred back to primary cards for  re-evaluation and medical management.   2. CAD s/p  CABG X 3 (LIMA to LAD, SVG to Cx, SVG to PDA 11/2005) - patent grafts on cath 11/2016. ASA/BB/Statin per cards  3. ICM s/p BiV-AICD (06/2006) and generator change  to [facility] [employer] CRT-D Model G141 / Serial# [id number] in (10/2013)-  Copy of card scanned into EMR     Thank you for the opportunity to participate in the care of this delightful gentleman.

## 2018-02-26 NOTE — Progress Notes (Signed)
Patient: David Le   Age: 83 y.o.     Patient Care Team:  Cheron Every, MD as PCP - General (Family Practice)  Ron Agee, MD (Cardiothoracic Surgery)  Altha Harm, MD (Cardiology)    PCP: Cheron Every, MD    Cardiologist: Glendon Axe    Diagnosis/Reason for Consultation: The primary encounter diagnosis was Non-rheumatic mitral regurgitation. Diagnoses of S/P mitral valve clip implantation, S/P CABG x 3, S/P implantation of automatic cardioverter/defibrillator (AICD), Ischemic cardiomyopathy, Coronary artery disease involving native coronary artery of native heart without angina pectoris, and Dyspnea, unspecified type  were also pertinent to this visit.    Problem List:   Patient Active Problem List   Diagnosis Code   ??? Non-rheumatic mitral regurgitation I34.0   ??? Coronary artery disease involving native coronary artery of native heart without angina pectoris I25.10   ??? Ischemic cardiomyopathy I25.5   ??? S/P CABG x 3 Z95.1   ??? S/P implantation of automatic cardioverter/defibrillator (AICD) Z95.810   ??? Mitral regurgitation I34.0      HPI: 83 y.o. Caucasian male s/p TMVr (MitraClip XTR and a MitraClip NTR??on the A2/P2 mitral leaflet scallops with reduction of mitral regurgitation from severe to trace)  for severe and symptomatic MR on 01/28/2017. He refused his 30 day post-op eval but is here today for his one yr eval per TVT Registry Guidelines.     [name] states he is doing ok but c/o considerable fatigue and DOE. He and his wife went on a [place] cruise this fall and he had trouble on the walking tours d/t DOE and fatigue and states this has only gotten worse over the past month or two.  He and his wife had been going to the gym three times/week but d/t his sx, they have stopped that as well.  He denies any changes in his wt (with the exception of gaining 5 lbs while traveling but has since lost this) and monitors this daily. He further  denies any changes in his medications in the past 6 months and states he hasn't taken his diuretic in about that same time.  He denies any LE or abdominal edema.  He c/o some orthopnea and PND that have worsened over the past couple of months but also reports ongoing nasal congestion that he has tried nasal sprays, abx and allergy medicines to treat w/o any success.  He denies any cough, sputum or fever.  He further denies any CP, chest tightness, lightheadedness, syncope, falls or palpitations.     He had an infected tooth root that required extensive oral surgery to treat. It is now healed and he has completed the requisite abx for that.  He denies any hospital admissions since his TMVr.  He currently takes a full ASA and denies any blood/blackness/tarriness of his stools. He also reports a good appetite.     He is accompanied by his wife of 55 yrs today.  She is the one that is more vocal and forthcoming about his DOE and fatigue. He has B hearing aids. He is indepdenet in every way and does not use any DME.     PMHx of MR, ICM, CABG X 3 (LIMA to LAD, SVG to Cx, SVG to PDA by Dr. Cristal Deer 11/2005), BiV AICD (06/2006 and generator change [facility] [employer] CRT-D Model G141 / Serial# [id number] in 10/2013), s/p cholecystectomy, s/p inguinal hernia repair that is [facility] by Dr. Glendon Axe for interventional evaluation of  MR.  ??  NYHA Classification: II   Class II (Mild): Slight limitation of physical activity. Comfortable at rest, but ordinary physical activity results in fatigue, palpitation, or dyspnea.     Angina Classification: 0   Class 0: No symptoms    Past Medical History:   Diagnosis Date   ??? CAD (coronary artery disease)    ??? Cancer (HCC)     SKIN CA   ??? Congestive heart failure (HCC)    ??? ICD (implantable cardioverter-defibrillator) in place    ??? Murmur    ??? Pacemaker    ??? Sleep apnea     does not use CPAP   ??? Valvular heart disease        Past Surgical History:    Procedure Laterality Date   ??? CABG, ARTERY-VEIN, THREE  2007   ??? HX APPENDECTOMY     ??? HX CORONARY ARTERY BYPASS GRAFT     ??? HX HEART CATHETERIZATION     ??? HX HEENT Bilateral     cataract extraction   ??? HX HERNIA REPAIR     ??? HX HERNIA REPAIR     ??? HX PACEMAKER  2015    AICD-GENERATOR CHANGE      Social History     Tobacco Use   ??? Smoking status: Never Smoker   ??? Smokeless tobacco: Never Used   Substance Use Topics   ??? Alcohol use: Yes     Comment: OCCASIONAL- gin; wine      Family History   Problem Relation Age of Onset   ??? Heart Disease Father    ??? Lung Disease Father    ??? Alcohol abuse Brother      Prior to Admission medications    Medication Sig Start Date End Date Taking? Authorizing Provider   multivitamin (ONE A DAY) tablet Take 1 Tab by mouth daily.   Yes Provider, Historical   carvedilol (COREG) 3.125 mg tablet Take 1 Tab by mouth two (2) times daily (with meals). 02/04/17  Yes Lilia Proierney, Renee E, NP   divalproex ER (DEPAKOTE ER) 500 mg ER tablet Take 500 mg by mouth daily.   Yes Provider, Historical   aspirin (ASPIRIN) 325 mg tablet Take 325 mg by mouth daily.   Yes Provider, Historical   atorvastatin (LIPITOR) 20 mg tablet Take 20 mg by mouth daily.   Yes Provider, Historical   ferrous gluconate 324 mg (38 mg iron) tablet Take  by mouth Daily (before breakfast).   Yes Provider, Historical   folic acid 800 mcg tablet Take 800 mcg by mouth daily.   Yes Provider, Historical   potassium chloride (K-DUR, KLOR-CON) 20 mEq tablet Take 20 mEq by mouth two (2) times a day.   Yes Provider, Historical   melatonin tab tablet Take 5 mg by mouth as needed.   Yes Provider, Historical   ferrous sulfate 324 mg (65 mg iron) tablet Take 324 mg by mouth Daily (before breakfast).    Provider, Historical   senna-docusate (PERICOLACE) 8.6-50 mg per tablet Take 1 Tab by mouth two (2) times a day. 01/29/17   Clide CliffNewton, Meredith, NP   acetaminophen (TYLENOL EXTRA STRENGTH) 500 mg tablet Take 500 mg by mouth.    Provider, Historical    furosemide (LASIX) 10 mg/mL oral solution Take 10 mg by mouth daily.  02/26/18  Provider, Historical       Allergies   Allergen Reactions   ??? Mold Shortness of Breath and Runny Nose   ??? Pcn [  Penicillins] Unknown (comments)     PATIENT STATES MOLD ALLERGY-NOT SURE HE EVER HAD REACTION WHEN GIVEN PENICILLIN       Current Medications:   Current Outpatient Medications   Medication Sig Dispense Refill   ??? multivitamin (ONE A DAY) tablet Take 1 Tab by mouth daily.     ??? carvedilol (COREG) 3.125 mg tablet Take 1 Tab by mouth two (2) times daily (with meals). 30 Tab 1   ??? divalproex ER (DEPAKOTE ER) 500 mg ER tablet Take 500 mg by mouth daily.     ??? aspirin (ASPIRIN) 325 mg tablet Take 325 mg by mouth daily.     ??? atorvastatin (LIPITOR) 20 mg tablet Take 20 mg by mouth daily.     ??? ferrous gluconate 324 mg (38 mg iron) tablet Take  by mouth Daily (before breakfast).     ??? folic acid 800 mcg tablet Take 800 mcg by mouth daily.     ??? potassium chloride (K-DUR, KLOR-CON) 20 mEq tablet Take 20 mEq by mouth two (2) times a day.     ??? melatonin tab tablet Take 5 mg by mouth as needed.     ??? ferrous sulfate 324 mg (65 mg iron) tablet Take 324 mg by mouth Daily (before breakfast).     ??? senna-docusate (PERICOLACE) 8.6-50 mg per tablet Take 1 Tab by mouth two (2) times a day. 60 Tab 0   ??? acetaminophen (TYLENOL EXTRA STRENGTH) 500 mg tablet Take 500 mg by mouth.         Vitals: Blood pressure 110/72, pulse 69, temperature 97.6 ??F (36.4 ??C), temperature source Oral, resp. rate 18, height 5\' 10"  (1.778 m), weight 181 lb (82.1 kg), SpO2 96 %.     Pre 6 in walk VS: HR 69, sat 96% on RA    Allergies: is allergic to mold and pcn [penicillins].    Review of Systems: Pertinent Positives per HPI   [x] ?Total of 13 systems reviewed as follows:  Constitutional: Negative fever, negative chills  Eyes:               Negative for amauroses fugax  ENT:                Negative sore throat,oral absecess   Endocrine        Negative for thyroid replacement Rx; goiter; DM  Respiratory:     Negative chronic cough,sputum production  Cards:              Negative for palpitations, lower extremity edema, varicosities, claudication  GI:                   Negative for dysphagia, bleeding, nausea, vomiting, diarrhea, and abdominal pain  Genitourinary: Negative for frequency, dysuria  Integument:     Negative for rash and pruritus  Hematologic:   Negative for easy bruising; bleeding dyscarsia  Musculoskel:   Negative for muscle weakness inhibiting ambulation  Neurological:   Negative for stroke, TIA, syncope, dizziness  Behavl/Psych: Negative for feelings of anxiety, depression     Cardiovascular Testing:   TTE 12/24/2016 (pre-op):  Left ventricle: Systolic function was normal. Ejection fraction was estimated in the range of 55 % to 60 %. There was mild hypokinesis of the apical anterior wall(s). Left atrium: The atrium was mildly dilated. Mitral valve: There was a mild prolapse involving the anterior and posterior leaflets. There was moderate to severe regurgitation. Tricuspid valve: There was mild to moderate regurgitation. ??  LEFT  VENTRICLE: Size was normal. Systolic function was normal. Ejection fraction was estimated in the range of 55 % to 60 %. There was mild hypokinesis of the apical anterior wall(s). RIGHT VENTRICLE: The size was normal. Systolic function was normal. A pacing wire was present. LEFT ATRIUM: The atrium was mildly dilated. ?? ATRIAL SEPTUM: Not well visualized. RIGHT ATRIUM: Size was at the upper limits of normal. A  pacing wire was present. MITRAL VALVE: There was mild thickening. There was a mild prolapse involving the anterior and posterior leaflets. DOPPLER: There was moderate to severe regurgitation. ??AORTIC VALVE: Not well visualized. DOPPLER: There was no stenosis. Therewas mild regurgitation. TRICUSPID VALVE: Not well visualized. DOPPLER: There was  mild to moderate regurgitation. There was no pulmonary hypertension. PULMONIC VALVE: Not well visualized. ??AORTA: The root exhibited normal size. PERICARDIUM: There was no pericardial effusion.   MR Vmax: 4.8 m/s  MR maxPG: 90.4 mmHg  MV VTI: 29.1 cm  MV Vmax: 1 m/s  MV Vmean: 0.5 m/s  MV maxPG: 3.9 mmHg  MV meanPG: 1.3 mmHg  MVA By PHT: 3.6 cm2  PAEDP: 15.5 mmHg  RVSP: 36.3 mmHg    TTE (01/28/2018 - done at [facility]. Report scanned into EMR): 60% LVEF, RV wnl, RVSP estimated 36 mmHg, mod MR, mild TR, mild AI    Physical Exam:  General: Well nourished well groomed gentleman appearing stated age accompanied by his wife  Neuro: A&OX3. MAE. PERRL. Steady un-assisted gait  Head:Normocephalic. Atraumatic. Symmetrical  Neck: Trachea Midline  Resp: CTA B. No Adv BS/cough/sputum/tachypnea with seated conversation  CV: S1S2 RRR. SEM III/VI. No JVD/carotid bruits. Pink/warm/dry extremities. No LE peripheral edema  ZO:XWRUEA ab. Soft. NT/ND. Active BS  GU: Voids  Integ: No obvious s/s of infection or breakdown  Musculo/Skeletal: FROM in all major joints. Good muscle tone    Clinic Evaluation:   KCCQ-12: scanned into EMR    6 minute walk test (pre-op): PreTest HR/02 sat:  See VS this visit - completed 6 min                                   Post Test HR/02 sat: 70 / 100% sat on RA                                   Distance Walked: 812 ft 5 inches    6 minute walk test today: PreTest HR/02 sat:  See VS this visit - completed 6 min             Post Test HR/02 sat: 79 / 96% on RA             Distance Walked: 776 ft 6 inches    Assessment/Plan:   1. MR s/p TMVr (MitraClip XTR and a MitraClip NTR??on the A2/P2 mitral leaflet scallops with reduction of mitral regurgitation from severe to trace)  for severe and symptomatic MR on 01/28/2017 - Inflow gradient at end of MitraClip case by TEE. No MV gradient noted in most recent TTE done at outside facility. ASA 81mg  daily only needed for valve patency  but pt on full dose ASA.  SBE prophylax reviewed. Pt with worsening fatigue and DOE. Only with low dose BB. May obtain some sx benefit from GDMT. Pt referred back to primary cards for re-evaluation and medical management.   2. CAD s/p  CABG X 3 (LIMA to LAD, SVG to Cx, SVG to PDA 11/2005) - patent grafts on cath 11/2016. ASA/BB/Statin per cards  3. ICM s/p BiV-AICD (06/2006) and generator [facility]  [facility] CRT-D Model G141 / Serial# [id number] in (10/2013)-  Copy of card scanned into EMR     Thank you for the opportunity to participate in the care of this delightful gentleman.

## 2022-01-12 ENCOUNTER — Inpatient Hospital Stay: Admit: 2022-01-12 | Payer: MEDICARE | Primary: Family Medicine

## 2022-01-12 ENCOUNTER — Emergency Department: Admit: 2022-01-12 | Payer: MEDICARE | Primary: Family Medicine

## 2022-01-12 ENCOUNTER — Inpatient Hospital Stay
Admit: 2022-01-12 | Discharge: 2022-01-12 | Disposition: A | Discharge status: Other Acute Inpatient Hospital | Payer: MEDICARE | Attending: Emergency Medicine

## 2022-01-12 DIAGNOSIS — W1830XA Fall on same level, unspecified, initial encounter: Secondary | ICD-10-CM

## 2022-01-12 DIAGNOSIS — S2241XA Multiple fractures of ribs, right side, initial encounter for closed fracture: Secondary | ICD-10-CM

## 2022-01-12 LAB — COMPREHENSIVE METABOLIC PANEL
ALT: 18 U/L (ref 12–78)
AST: 14 U/L — ABNORMAL LOW (ref 15–37)
Albumin/Globulin Ratio: 1.1 (ref 1.1–2.2)
Albumin: 3.3 g/dL — ABNORMAL LOW (ref 3.5–5.0)
Alk Phosphatase: 46 U/L (ref 45–117)
Anion Gap: 5 mmol/L (ref 5–15)
BUN: 12 MG/DL (ref 6–20)
Bun/Cre Ratio: 13 (ref 12–20)
CO2: 30 mmol/L (ref 21–32)
Calcium: 8.8 MG/DL (ref 8.5–10.1)
Chloride: 106 mmol/L (ref 97–108)
Creatinine: 0.89 MG/DL (ref 0.70–1.30)
Est, Glom Filt Rate: >60 mL/min/{1.73_m2} (ref >60)
Globulin: 2.9 g/dL (ref 2.0–4.0)
Glucose: 93 mg/dL (ref 65–100)
Potassium: 4.5 mmol/L (ref 3.5–5.1)
Sodium: 141 mmol/L (ref 136–145)
Total Bilirubin: 0.4 MG/DL (ref 0.2–1.0)
Total Protein: 6.2 g/dL — ABNORMAL LOW (ref 6.4–8.2)

## 2022-01-12 LAB — CBC WITH AUTO DIFFERENTIAL
Absolute Immature Granulocyte: 0 10*3/uL (ref 0.00–0.04)
Basophils %: 0 % (ref 0–1)
Basophils Absolute: 0 10*3/uL (ref 0.0–0.1)
Eosinophils %: 1 % (ref 0–7)
Eosinophils Absolute: 0.1 10*3/uL (ref 0.0–0.4)
Hematocrit: 41.3 % (ref 36.6–50.3)
Hemoglobin: 14 g/dL (ref 12.1–17.0)
Immature Granulocytes: 0 % (ref 0–0.5)
Lymphocytes %: 11 % — ABNORMAL LOW (ref 12–49)
Lymphocytes Absolute: 1.2 10*3/uL (ref 0.8–3.5)
MCH: 33.8 PG (ref 26.0–34.0)
MCHC: 33.9 g/dL (ref 30.0–36.5)
MCV: 99.8 FL — ABNORMAL HIGH (ref 80.0–99.0)
MPV: 11.6 FL (ref 8.9–12.9)
Monocytes %: 9 % (ref 5–13)
Monocytes Absolute: 1 10*3/uL (ref 0.0–1.0)
Neutrophils %: 79 % — ABNORMAL HIGH (ref 32–75)
Neutrophils Absolute: 8.6 10*3/uL — ABNORMAL HIGH (ref 1.8–8.0)
Nucleated RBCs: 0 PER 100 WBC
Platelets: 131 10*3/uL — ABNORMAL LOW (ref 150–400)
RBC: 4.14 M/uL (ref 4.10–5.70)
RDW: 12.6 % (ref 11.5–14.5)
WBC: 10.9 10*3/uL (ref 4.1–11.1)
nRBC: 0 10*3/uL (ref 0.00–0.01)

## 2022-01-12 LAB — MAGNESIUM: Magnesium: 2.1 mg/dL (ref 1.6–2.4)

## 2022-01-12 MED ORDER — IBUPROFEN 600 MG PO TABS
600 MG | ORAL | Status: AC
Start: 2022-01-12 — End: 2022-01-12
  Administered 2022-01-12: 19:00:00 600 mg via ORAL

## 2022-01-12 MED ORDER — OXYCODONE-ACETAMINOPHEN 5-325 MG PO TABS
5-325 MG | ORAL | Status: AC
Start: 2022-01-12 — End: 2022-01-12
  Administered 2022-01-12: 19:00:00 1 via ORAL

## 2022-01-12 MED FILL — IBUPROFEN 600 MG PO TABS: 600 MG | ORAL | Qty: 1

## 2022-01-12 MED FILL — OXYCODONE-ACETAMINOPHEN 5-325 MG PO TABS: 5-325 MG | ORAL | Qty: 1

## 2022-01-12 NOTE — ED Notes (Signed)
Patient's wife signed EMTALA paperwork per patient request. EMTALA paperwork signed and given to transport team. Vital signs stable at time of transfer.      Burt Knack, RN  01/12/22 [phone]

## 2022-01-12 NOTE — ED Notes (Signed)
Sling applied to right arm per order.      Burt Knack, RN  01/12/22 1531

## 2022-01-12 NOTE — ED Triage Notes (Signed)
Patient arrives via wheelchair with complaints of anterior right shoulder pain after fall prior to arrival. He reports he tripped on a concrete porch causing him to land on his right side.     Laceration above right eye and some bruising noted under right eye. Bruising and swelling noted to anterior shoulder.     Denies LOC.

## 2022-01-12 NOTE — ED Notes (Signed)
Report given to Doylene Canning at [facility] ED.      Burt Knack, RN  01/12/22 1539

## 2022-01-12 NOTE — ED Notes (Signed)
Ice pack applied to right shoulder.      Burt Knack, RN  01/12/22 1331

## 2022-01-12 NOTE — ED Provider Notes (Signed)
[facility] EMERGENCY DEPT  EMERGENCY DEPARTMENT ENCOUNTER      Pt Name: David Le  MRN: [id number]  Birthdate 13-Feb-1936  Date of evaluation: 01/12/2022  Provider: Lesia Sago, MD    CHIEF COMPLAINT       Chief Complaint   Patient presents with    Fall    Shoulder Pain         HISTORY OF PRESENT ILLNESS   (Location/Symptom, Timing/Onset, Context/Setting, Quality, Duration, Modifying Factors, Severity)  Note limiting factors.   86 year old male presents from home [place] by family with complaints of injuries from a fall.  States he was out on the deck when he tripped falling onto his right side.  He hit his head and landed on his right arm.  Complaining of pain to her right shoulder and his head.  No loss of consciousness or vomiting.  No pain medications taken.  Does not take blood thinning medications.    The history is provided by the patient and a relative.         Review of External Medical Records:     Nursing Notes were reviewed.    REVIEW OF SYSTEMS    (2-9 systems for level 4, 10 or more for level 5)     Review of Systems   Constitutional:  Negative for fatigue.   HENT:  Negative for sore throat.    Eyes:  Negative for visual disturbance.   Respiratory:  Negative for shortness of breath.    Cardiovascular:  Negative for palpitations.   Gastrointestinal:  Negative for constipation.   Genitourinary:  Negative for difficulty urinating.   Musculoskeletal:  Negative for myalgias.   Skin:  Negative for rash.       Except as noted above the remainder of the review of systems was reviewed and negative.       PAST MEDICAL HISTORY     Past Medical History:   Diagnosis Date    CAD (coronary artery disease)     Cancer (HCC)     SKIN CA    Congestive heart failure (HCC)     ICD (implantable cardioverter-defibrillator) in place     Murmur     Pacemaker     Sleep apnea     does not use CPAP    Valvular heart disease          SURGICAL HISTORY       Past Surgical History:   Procedure Laterality Date    APPENDECTOMY      CABG,  ARTERY-VEIN, THREE  2007    CARDIAC CATHETERIZATION      CORONARY ARTERY BYPASS GRAFT      HEENT Bilateral     cataract extraction    HERNIA REPAIR      HERNIA REPAIR      PACEMAKER  2015    AICD-GENERATOR CHANGE         CURRENT MEDICATIONS       Previous Medications    ASPIRIN 81 MG EC TABLET    Take 1 tablet by mouth daily       ALLERGIES     Penicillins    FAMILY HISTORY       Family History   Problem Relation Age of Onset    Alcohol Abuse Brother     Lung Disease Father     Heart Disease Father           SOCIAL HISTORY       Social History     Socioeconomic  History    Marital status: Married    Years of education: 16   Tobacco Use    Smoking status: Never    Smokeless tobacco: Never   Substance and Sexual Activity    Alcohol use: Yes    Drug use: No           PHYSICAL EXAM    (up to 7 for level 4, 8 or more for level 5)     ED Triage Vitals   BP Temp Temp src Pulse Resp SpO2 Height Weight   -- -- -- -- -- -- -- --       Body mass index is 25.25 kg/m.    Physical Exam  Constitutional:       Appearance: He is not ill-appearing.   HENT:      Head: Normocephalic.      Comments: Abrasion and contusion around the right eye     Mouth/Throat:      Mouth: Mucous membranes are moist.   Eyes:      General: No scleral icterus.  Cardiovascular:      Rate and Rhythm: Normal rate.   Pulmonary:      Effort: Pulmonary effort is normal.   Abdominal:      General: There is no distension.   Musculoskeletal:         General: No deformity.      Cervical back: Neck supple.      Comments: Right shoulder showing evidence of a large contusion to the anterior shoulder.  No obvious deformities.  Neurovascular intact.   Skin:     General: Skin is warm.   Neurological:      General: No focal deficit present.      Mental Status: He is alert and oriented to person, place, and time.         DIAGNOSTIC RESULTS     EKG: All EKG's are interpreted by the Emergency Department Physician who either signs or Co-signs this chart in the absence of a  cardiologist.        RADIOLOGY:   Non-plain film images such as CT, Ultrasound and MRI are read by the radiologist. Plain radiographic images are visualized and preliminarily interpreted by the emergency physician with the below findings:        Interpretation per the Radiologist below, if available at the time of this note:    CT HEAD WO CONTRAST   Final Result   No evidence of acute process.            CT CERVICAL SPINE WO CONTRAST   Final Result   No acute fracture or subluxation.          CT CHEST WO CONTRAST   Final Result   Comminuted right humeral neck fracture with impaction. Mildly displaced right   lateral fifth through seventh rib fractures.      CT SHOULDER RIGHT WO CONTRAST   Final Result   Acute comminuted fracture of the proximal right humerus as above.         XR SHOULDER RIGHT (MIN 2 VIEWS)   Final Result   Comminuted right humeral neck fracture with impaction.           LABS:  Labs Reviewed   CBC WITH AUTO DIFFERENTIAL   COMPREHENSIVE METABOLIC PANEL   MAGNESIUM   EXTRA TUBES HOLD       All other labs were within normal range or not returned as of this dictation.    EMERGENCY  DEPARTMENT COURSE and DIFFERENTIAL DIAGNOSIS/MDM:   Vitals:    Vitals:    01/12/22 1326   BP: (!) 136/54   Pulse: 78   Resp: 16   Temp: 97 F (36.1 C)   TempSrc: Axillary   SpO2: 96%   Weight: 79.8 kg (176 lb)   Height: 1.778 m (5\' 10" )           Medical Decision Making  Assessment: Patient seen with injuries from a ground-level fall.  He has obvious head and facial contusions but head CT was negative.  CT of his C-spine was unremarkable.  CT of his chest showed 3 contiguous rib fractures on the right side.  He also has some evidence of a comminuted right humeral neck fracture.  Patient is resting more comfortably with stable vital signs.  Plan to admit him to the hospital for pain control and physical therapy.  Will need to go to [facility] cannot admit traumatic injuries like this.  I discussed this with Dr.  Gladstone Pih from the trauma service who agreed to except the patient in transfer.  Patient will be transferred to the emergency department [place] for further evaluation.    Amount and/or Complexity of Data Reviewed  Labs: ordered.  Radiology: ordered.  ECG/medicine tests: ordered. Decision-making details documented in ED Course.    Risk  Prescription drug management.            REASSESSMENT     ED Course as of 01/12/22 1515   Sun Jan 12, 2022   1509 EKG 12 Lead  ED ECG interpretation:  Rhythm: A-sensed, V-paced. Rate (approx.): 76.  Axis: normal.  ST segment:  No concerning ST elevations or depressions. This EKG was independently interpreted by Nino Glow, MD,ED Provider.   [JM]      ED Course User Index  [JM] Nino Glow, MD           CONSULTS:  None    PROCEDURES:  Unless otherwise noted below, none     Procedures      FINAL IMPRESSION      1. Ground-level fall    2. Injury of head, initial encounter    3. Fx humeral neck, right, closed, initial encounter    4. Multiple fractures of ribs, right side, initial encounter for closed fracture          DISPOSITION/PLAN   DISPOSITION Decision To Transfer 01/12/2022 03:00:10 PM      PATIENT REFERRED TO:  No follow-up provider specified.    DISCHARGE MEDICATIONS:  New Prescriptions    No medications on file     Total critical care time (not including time spent performing separately reportable procedures): 35      (Please note that portions of this note were completed with a voice recognition program.  Efforts were made to edit the dictations but occasionally words are mis-transcribed.)    Gladis Riffle, MD (electronically signed)  Emergency Attending Physician / Physician Assistant / Nurse Practitioner             Nino Glow, MD  01/12/22 [phone]

## 2022-01-12 NOTE — ED Notes (Signed)
Critical Care Transport in ED. Report given to transport team.      Burt Knack, RN  01/12/22 1600

## 2022-01-14 LAB — EKG 12-LEAD
Atrial Rate: 76 {beats}/min
P Axis: 73 degrees
P-R Interval: 156 ms
Q-T Interval: 450 ms
QRS Duration: 144 ms
QTc Calculation (Bazett): 506 ms
R Axis: 237 degrees
T Axis: 79 degrees
Ventricular Rate: 76 {beats}/min
